# Patient Record
Sex: Female | Born: 1965
Health system: Southern US, Community
[De-identification: ages and names within clinical notes are randomized; demographics above are authoritative.]

## PROBLEM LIST (undated history)

## (undated) DIAGNOSIS — E785 Hyperlipidemia, unspecified: Secondary | ICD-10-CM

## (undated) DIAGNOSIS — G43909 Migraine, unspecified, not intractable, without status migrainosus: Secondary | ICD-10-CM

## (undated) DIAGNOSIS — M1990 Unspecified osteoarthritis, unspecified site: Secondary | ICD-10-CM

## (undated) HISTORY — DX: Hyperlipidemia, unspecified: E78.5

## (undated) HISTORY — PX: CHOLECYSTECTOMY: SHX55

## (undated) HISTORY — DX: Migraine, unspecified, not intractable, without status migrainosus: G43.909

## (undated) HISTORY — PX: WISDOM TOOTH EXTRACTION: SHX21

---

## 2015-10-12 DIAGNOSIS — Z0000 Encounter for general adult medical examination without abnormal findings: Secondary | ICD-10-CM | POA: Diagnosis not present

## 2015-10-12 DIAGNOSIS — E559 Vitamin D deficiency, unspecified: Secondary | ICD-10-CM | POA: Diagnosis not present

## 2015-10-12 DIAGNOSIS — E785 Hyperlipidemia, unspecified: Secondary | ICD-10-CM | POA: Diagnosis not present

## 2015-10-14 DIAGNOSIS — E669 Obesity, unspecified: Secondary | ICD-10-CM | POA: Diagnosis not present

## 2015-10-14 DIAGNOSIS — Z6836 Body mass index (BMI) 36.0-36.9, adult: Secondary | ICD-10-CM | POA: Diagnosis not present

## 2015-10-14 DIAGNOSIS — G43009 Migraine without aura, not intractable, without status migrainosus: Secondary | ICD-10-CM | POA: Diagnosis not present

## 2015-10-14 DIAGNOSIS — E785 Hyperlipidemia, unspecified: Secondary | ICD-10-CM | POA: Diagnosis not present

## 2015-10-14 DIAGNOSIS — Z0000 Encounter for general adult medical examination without abnormal findings: Secondary | ICD-10-CM | POA: Diagnosis not present

## 2016-01-12 MED FILL — TOPIRAMATE 50 MG TABLET: 50 | 90 days supply | Qty: 90 | Fill #0

## 2016-01-12 MED FILL — ATORVASTATIN 10 MG TABLET: 10 | 90 days supply | Qty: 90 | Fill #0

## 2016-03-18 DIAGNOSIS — H524 Presbyopia: Secondary | ICD-10-CM | POA: Diagnosis not present

## 2016-03-22 DIAGNOSIS — M4317 Spondylolisthesis, lumbosacral region: Secondary | ICD-10-CM | POA: Diagnosis not present

## 2016-03-22 DIAGNOSIS — Q72811 Congenital shortening of right lower limb: Secondary | ICD-10-CM | POA: Diagnosis not present

## 2016-03-22 DIAGNOSIS — M9905 Segmental and somatic dysfunction of pelvic region: Secondary | ICD-10-CM | POA: Diagnosis not present

## 2016-03-22 DIAGNOSIS — M9904 Segmental and somatic dysfunction of sacral region: Secondary | ICD-10-CM | POA: Diagnosis not present

## 2016-03-22 DIAGNOSIS — M9903 Segmental and somatic dysfunction of lumbar region: Secondary | ICD-10-CM | POA: Diagnosis not present

## 2016-03-22 DIAGNOSIS — M5137 Other intervertebral disc degeneration, lumbosacral region: Secondary | ICD-10-CM | POA: Diagnosis not present

## 2016-03-26 DIAGNOSIS — Q72811 Congenital shortening of right lower limb: Secondary | ICD-10-CM | POA: Diagnosis not present

## 2016-03-26 DIAGNOSIS — M4317 Spondylolisthesis, lumbosacral region: Secondary | ICD-10-CM | POA: Diagnosis not present

## 2016-03-26 DIAGNOSIS — M9905 Segmental and somatic dysfunction of pelvic region: Secondary | ICD-10-CM | POA: Diagnosis not present

## 2016-03-26 DIAGNOSIS — M9904 Segmental and somatic dysfunction of sacral region: Secondary | ICD-10-CM | POA: Diagnosis not present

## 2016-03-26 DIAGNOSIS — M5137 Other intervertebral disc degeneration, lumbosacral region: Secondary | ICD-10-CM | POA: Diagnosis not present

## 2016-03-26 DIAGNOSIS — M9903 Segmental and somatic dysfunction of lumbar region: Secondary | ICD-10-CM | POA: Diagnosis not present

## 2016-03-27 DIAGNOSIS — Q72811 Congenital shortening of right lower limb: Secondary | ICD-10-CM | POA: Diagnosis not present

## 2016-03-27 DIAGNOSIS — M5137 Other intervertebral disc degeneration, lumbosacral region: Secondary | ICD-10-CM | POA: Diagnosis not present

## 2016-03-27 DIAGNOSIS — M4317 Spondylolisthesis, lumbosacral region: Secondary | ICD-10-CM | POA: Diagnosis not present

## 2016-03-27 DIAGNOSIS — M9905 Segmental and somatic dysfunction of pelvic region: Secondary | ICD-10-CM | POA: Diagnosis not present

## 2016-03-27 DIAGNOSIS — M9904 Segmental and somatic dysfunction of sacral region: Secondary | ICD-10-CM | POA: Diagnosis not present

## 2016-03-27 DIAGNOSIS — M9903 Segmental and somatic dysfunction of lumbar region: Secondary | ICD-10-CM | POA: Diagnosis not present

## 2016-03-29 DIAGNOSIS — M9904 Segmental and somatic dysfunction of sacral region: Secondary | ICD-10-CM | POA: Diagnosis not present

## 2016-03-29 DIAGNOSIS — M4317 Spondylolisthesis, lumbosacral region: Secondary | ICD-10-CM | POA: Diagnosis not present

## 2016-03-29 DIAGNOSIS — M5137 Other intervertebral disc degeneration, lumbosacral region: Secondary | ICD-10-CM | POA: Diagnosis not present

## 2016-03-29 DIAGNOSIS — Q72811 Congenital shortening of right lower limb: Secondary | ICD-10-CM | POA: Diagnosis not present

## 2016-03-29 DIAGNOSIS — M9905 Segmental and somatic dysfunction of pelvic region: Secondary | ICD-10-CM | POA: Diagnosis not present

## 2016-03-29 DIAGNOSIS — M9903 Segmental and somatic dysfunction of lumbar region: Secondary | ICD-10-CM | POA: Diagnosis not present

## 2016-04-02 DIAGNOSIS — M4317 Spondylolisthesis, lumbosacral region: Secondary | ICD-10-CM | POA: Diagnosis not present

## 2016-04-02 DIAGNOSIS — Q72811 Congenital shortening of right lower limb: Secondary | ICD-10-CM | POA: Diagnosis not present

## 2016-04-02 DIAGNOSIS — M9903 Segmental and somatic dysfunction of lumbar region: Secondary | ICD-10-CM | POA: Diagnosis not present

## 2016-04-02 DIAGNOSIS — M9904 Segmental and somatic dysfunction of sacral region: Secondary | ICD-10-CM | POA: Diagnosis not present

## 2016-04-02 DIAGNOSIS — M9905 Segmental and somatic dysfunction of pelvic region: Secondary | ICD-10-CM | POA: Diagnosis not present

## 2016-04-02 DIAGNOSIS — M5137 Other intervertebral disc degeneration, lumbosacral region: Secondary | ICD-10-CM | POA: Diagnosis not present

## 2016-04-03 DIAGNOSIS — M9905 Segmental and somatic dysfunction of pelvic region: Secondary | ICD-10-CM | POA: Diagnosis not present

## 2016-04-03 DIAGNOSIS — M5137 Other intervertebral disc degeneration, lumbosacral region: Secondary | ICD-10-CM | POA: Diagnosis not present

## 2016-04-03 DIAGNOSIS — Q72811 Congenital shortening of right lower limb: Secondary | ICD-10-CM | POA: Diagnosis not present

## 2016-04-03 DIAGNOSIS — M9903 Segmental and somatic dysfunction of lumbar region: Secondary | ICD-10-CM | POA: Diagnosis not present

## 2016-04-03 DIAGNOSIS — M9904 Segmental and somatic dysfunction of sacral region: Secondary | ICD-10-CM | POA: Diagnosis not present

## 2016-04-03 DIAGNOSIS — M4317 Spondylolisthesis, lumbosacral region: Secondary | ICD-10-CM | POA: Diagnosis not present

## 2016-04-05 DIAGNOSIS — Q72811 Congenital shortening of right lower limb: Secondary | ICD-10-CM | POA: Diagnosis not present

## 2016-04-05 DIAGNOSIS — M5137 Other intervertebral disc degeneration, lumbosacral region: Secondary | ICD-10-CM | POA: Diagnosis not present

## 2016-04-05 DIAGNOSIS — M4317 Spondylolisthesis, lumbosacral region: Secondary | ICD-10-CM | POA: Diagnosis not present

## 2016-04-05 DIAGNOSIS — M9904 Segmental and somatic dysfunction of sacral region: Secondary | ICD-10-CM | POA: Diagnosis not present

## 2016-04-05 DIAGNOSIS — M9901 Segmental and somatic dysfunction of cervical region: Secondary | ICD-10-CM | POA: Diagnosis not present

## 2016-04-05 DIAGNOSIS — M9903 Segmental and somatic dysfunction of lumbar region: Secondary | ICD-10-CM | POA: Diagnosis not present

## 2016-04-05 DIAGNOSIS — G43009 Migraine without aura, not intractable, without status migrainosus: Secondary | ICD-10-CM | POA: Diagnosis not present

## 2016-04-05 DIAGNOSIS — M5381 Other specified dorsopathies, occipito-atlanto-axial region: Secondary | ICD-10-CM | POA: Diagnosis not present

## 2016-04-05 DIAGNOSIS — M9905 Segmental and somatic dysfunction of pelvic region: Secondary | ICD-10-CM | POA: Diagnosis not present

## 2016-04-07 DIAGNOSIS — E785 Hyperlipidemia, unspecified: Secondary | ICD-10-CM | POA: Diagnosis not present

## 2016-04-09 DIAGNOSIS — M9905 Segmental and somatic dysfunction of pelvic region: Secondary | ICD-10-CM | POA: Diagnosis not present

## 2016-04-09 DIAGNOSIS — M5137 Other intervertebral disc degeneration, lumbosacral region: Secondary | ICD-10-CM | POA: Diagnosis not present

## 2016-04-09 DIAGNOSIS — M9904 Segmental and somatic dysfunction of sacral region: Secondary | ICD-10-CM | POA: Diagnosis not present

## 2016-04-09 DIAGNOSIS — M9903 Segmental and somatic dysfunction of lumbar region: Secondary | ICD-10-CM | POA: Diagnosis not present

## 2016-04-09 DIAGNOSIS — Q72811 Congenital shortening of right lower limb: Secondary | ICD-10-CM | POA: Diagnosis not present

## 2016-04-09 DIAGNOSIS — M9901 Segmental and somatic dysfunction of cervical region: Secondary | ICD-10-CM | POA: Diagnosis not present

## 2016-04-09 DIAGNOSIS — M4317 Spondylolisthesis, lumbosacral region: Secondary | ICD-10-CM | POA: Diagnosis not present

## 2016-04-09 DIAGNOSIS — G43009 Migraine without aura, not intractable, without status migrainosus: Secondary | ICD-10-CM | POA: Diagnosis not present

## 2016-04-09 DIAGNOSIS — M5381 Other specified dorsopathies, occipito-atlanto-axial region: Secondary | ICD-10-CM | POA: Diagnosis not present

## 2016-04-10 DIAGNOSIS — M5381 Other specified dorsopathies, occipito-atlanto-axial region: Secondary | ICD-10-CM | POA: Diagnosis not present

## 2016-04-10 DIAGNOSIS — M5137 Other intervertebral disc degeneration, lumbosacral region: Secondary | ICD-10-CM | POA: Diagnosis not present

## 2016-04-10 DIAGNOSIS — M5136 Other intervertebral disc degeneration, lumbar region: Secondary | ICD-10-CM | POA: Diagnosis not present

## 2016-04-10 DIAGNOSIS — G43009 Migraine without aura, not intractable, without status migrainosus: Secondary | ICD-10-CM | POA: Diagnosis not present

## 2016-04-10 DIAGNOSIS — M9905 Segmental and somatic dysfunction of pelvic region: Secondary | ICD-10-CM | POA: Diagnosis not present

## 2016-04-10 DIAGNOSIS — M4317 Spondylolisthesis, lumbosacral region: Secondary | ICD-10-CM | POA: Diagnosis not present

## 2016-04-10 DIAGNOSIS — Q72811 Congenital shortening of right lower limb: Secondary | ICD-10-CM | POA: Diagnosis not present

## 2016-04-10 DIAGNOSIS — M9904 Segmental and somatic dysfunction of sacral region: Secondary | ICD-10-CM | POA: Diagnosis not present

## 2016-04-10 DIAGNOSIS — M9901 Segmental and somatic dysfunction of cervical region: Secondary | ICD-10-CM | POA: Diagnosis not present

## 2016-04-10 DIAGNOSIS — M9903 Segmental and somatic dysfunction of lumbar region: Secondary | ICD-10-CM | POA: Diagnosis not present

## 2016-04-12 DIAGNOSIS — M9905 Segmental and somatic dysfunction of pelvic region: Secondary | ICD-10-CM | POA: Diagnosis not present

## 2016-04-12 DIAGNOSIS — M4317 Spondylolisthesis, lumbosacral region: Secondary | ICD-10-CM | POA: Diagnosis not present

## 2016-04-12 DIAGNOSIS — M9904 Segmental and somatic dysfunction of sacral region: Secondary | ICD-10-CM | POA: Diagnosis not present

## 2016-04-12 DIAGNOSIS — Q72811 Congenital shortening of right lower limb: Secondary | ICD-10-CM | POA: Diagnosis not present

## 2016-04-12 DIAGNOSIS — M9901 Segmental and somatic dysfunction of cervical region: Secondary | ICD-10-CM | POA: Diagnosis not present

## 2016-04-12 DIAGNOSIS — M9903 Segmental and somatic dysfunction of lumbar region: Secondary | ICD-10-CM | POA: Diagnosis not present

## 2016-04-12 DIAGNOSIS — M5137 Other intervertebral disc degeneration, lumbosacral region: Secondary | ICD-10-CM | POA: Diagnosis not present

## 2016-04-12 DIAGNOSIS — M5381 Other specified dorsopathies, occipito-atlanto-axial region: Secondary | ICD-10-CM | POA: Diagnosis not present

## 2016-04-12 DIAGNOSIS — G43009 Migraine without aura, not intractable, without status migrainosus: Secondary | ICD-10-CM | POA: Diagnosis not present

## 2016-04-13 DIAGNOSIS — G43009 Migraine without aura, not intractable, without status migrainosus: Secondary | ICD-10-CM | POA: Diagnosis not present

## 2016-04-13 DIAGNOSIS — E785 Hyperlipidemia, unspecified: Secondary | ICD-10-CM | POA: Diagnosis not present

## 2016-04-13 DIAGNOSIS — Z1211 Encounter for screening for malignant neoplasm of colon: Secondary | ICD-10-CM | POA: Diagnosis not present

## 2016-04-13 MED FILL — ATORVASTATIN 10 MG TABLET: 10 | 90 days supply | Qty: 90 | Fill #0

## 2016-04-13 MED FILL — TOPIRAMATE 50 MG TABLET: 50 | 90 days supply | Qty: 90 | Fill #0

## 2016-04-16 ENCOUNTER — Encounter: Payer: Self-pay | Admitting: Gastroenterology

## 2016-04-16 DIAGNOSIS — M9904 Segmental and somatic dysfunction of sacral region: Secondary | ICD-10-CM | POA: Diagnosis not present

## 2016-04-16 DIAGNOSIS — M9905 Segmental and somatic dysfunction of pelvic region: Secondary | ICD-10-CM | POA: Diagnosis not present

## 2016-04-16 DIAGNOSIS — M4317 Spondylolisthesis, lumbosacral region: Secondary | ICD-10-CM | POA: Diagnosis not present

## 2016-04-16 DIAGNOSIS — M9901 Segmental and somatic dysfunction of cervical region: Secondary | ICD-10-CM | POA: Diagnosis not present

## 2016-04-16 DIAGNOSIS — G43009 Migraine without aura, not intractable, without status migrainosus: Secondary | ICD-10-CM | POA: Diagnosis not present

## 2016-04-16 DIAGNOSIS — M9903 Segmental and somatic dysfunction of lumbar region: Secondary | ICD-10-CM | POA: Diagnosis not present

## 2016-04-16 DIAGNOSIS — M5381 Other specified dorsopathies, occipito-atlanto-axial region: Secondary | ICD-10-CM | POA: Diagnosis not present

## 2016-04-16 DIAGNOSIS — Q72811 Congenital shortening of right lower limb: Secondary | ICD-10-CM | POA: Diagnosis not present

## 2016-04-16 DIAGNOSIS — M5137 Other intervertebral disc degeneration, lumbosacral region: Secondary | ICD-10-CM | POA: Diagnosis not present

## 2016-04-17 DIAGNOSIS — M5381 Other specified dorsopathies, occipito-atlanto-axial region: Secondary | ICD-10-CM | POA: Diagnosis not present

## 2016-04-17 DIAGNOSIS — M5137 Other intervertebral disc degeneration, lumbosacral region: Secondary | ICD-10-CM | POA: Diagnosis not present

## 2016-04-17 DIAGNOSIS — M9903 Segmental and somatic dysfunction of lumbar region: Secondary | ICD-10-CM | POA: Diagnosis not present

## 2016-04-17 DIAGNOSIS — M9901 Segmental and somatic dysfunction of cervical region: Secondary | ICD-10-CM | POA: Diagnosis not present

## 2016-04-17 DIAGNOSIS — G43009 Migraine without aura, not intractable, without status migrainosus: Secondary | ICD-10-CM | POA: Diagnosis not present

## 2016-04-17 DIAGNOSIS — Q72811 Congenital shortening of right lower limb: Secondary | ICD-10-CM | POA: Diagnosis not present

## 2016-04-17 DIAGNOSIS — M9905 Segmental and somatic dysfunction of pelvic region: Secondary | ICD-10-CM | POA: Diagnosis not present

## 2016-04-17 DIAGNOSIS — M9904 Segmental and somatic dysfunction of sacral region: Secondary | ICD-10-CM | POA: Diagnosis not present

## 2016-04-17 DIAGNOSIS — M4317 Spondylolisthesis, lumbosacral region: Secondary | ICD-10-CM | POA: Diagnosis not present

## 2016-04-19 DIAGNOSIS — M4317 Spondylolisthesis, lumbosacral region: Secondary | ICD-10-CM | POA: Diagnosis not present

## 2016-04-19 DIAGNOSIS — Q72811 Congenital shortening of right lower limb: Secondary | ICD-10-CM | POA: Diagnosis not present

## 2016-04-19 DIAGNOSIS — M9903 Segmental and somatic dysfunction of lumbar region: Secondary | ICD-10-CM | POA: Diagnosis not present

## 2016-04-19 DIAGNOSIS — M9904 Segmental and somatic dysfunction of sacral region: Secondary | ICD-10-CM | POA: Diagnosis not present

## 2016-04-19 DIAGNOSIS — M9901 Segmental and somatic dysfunction of cervical region: Secondary | ICD-10-CM | POA: Diagnosis not present

## 2016-04-19 DIAGNOSIS — G43009 Migraine without aura, not intractable, without status migrainosus: Secondary | ICD-10-CM | POA: Diagnosis not present

## 2016-04-19 DIAGNOSIS — M5137 Other intervertebral disc degeneration, lumbosacral region: Secondary | ICD-10-CM | POA: Diagnosis not present

## 2016-04-19 DIAGNOSIS — M5381 Other specified dorsopathies, occipito-atlanto-axial region: Secondary | ICD-10-CM | POA: Diagnosis not present

## 2016-04-19 DIAGNOSIS — M9905 Segmental and somatic dysfunction of pelvic region: Secondary | ICD-10-CM | POA: Diagnosis not present

## 2016-04-23 DIAGNOSIS — M4317 Spondylolisthesis, lumbosacral region: Secondary | ICD-10-CM | POA: Diagnosis not present

## 2016-04-23 DIAGNOSIS — M9904 Segmental and somatic dysfunction of sacral region: Secondary | ICD-10-CM | POA: Diagnosis not present

## 2016-04-23 DIAGNOSIS — M9903 Segmental and somatic dysfunction of lumbar region: Secondary | ICD-10-CM | POA: Diagnosis not present

## 2016-04-23 DIAGNOSIS — M9905 Segmental and somatic dysfunction of pelvic region: Secondary | ICD-10-CM | POA: Diagnosis not present

## 2016-04-23 DIAGNOSIS — M9901 Segmental and somatic dysfunction of cervical region: Secondary | ICD-10-CM | POA: Diagnosis not present

## 2016-04-23 DIAGNOSIS — G43009 Migraine without aura, not intractable, without status migrainosus: Secondary | ICD-10-CM | POA: Diagnosis not present

## 2016-04-23 DIAGNOSIS — M5137 Other intervertebral disc degeneration, lumbosacral region: Secondary | ICD-10-CM | POA: Diagnosis not present

## 2016-04-23 DIAGNOSIS — Q72811 Congenital shortening of right lower limb: Secondary | ICD-10-CM | POA: Diagnosis not present

## 2016-04-23 DIAGNOSIS — M5381 Other specified dorsopathies, occipito-atlanto-axial region: Secondary | ICD-10-CM | POA: Diagnosis not present

## 2016-04-24 DIAGNOSIS — Z01419 Encounter for gynecological examination (general) (routine) without abnormal findings: Secondary | ICD-10-CM | POA: Diagnosis not present

## 2016-04-24 DIAGNOSIS — E559 Vitamin D deficiency, unspecified: Secondary | ICD-10-CM | POA: Diagnosis not present

## 2016-04-25 DIAGNOSIS — M5137 Other intervertebral disc degeneration, lumbosacral region: Secondary | ICD-10-CM | POA: Diagnosis not present

## 2016-04-25 DIAGNOSIS — G43009 Migraine without aura, not intractable, without status migrainosus: Secondary | ICD-10-CM | POA: Diagnosis not present

## 2016-04-25 DIAGNOSIS — M9905 Segmental and somatic dysfunction of pelvic region: Secondary | ICD-10-CM | POA: Diagnosis not present

## 2016-04-25 DIAGNOSIS — Q72811 Congenital shortening of right lower limb: Secondary | ICD-10-CM | POA: Diagnosis not present

## 2016-04-25 DIAGNOSIS — M4317 Spondylolisthesis, lumbosacral region: Secondary | ICD-10-CM | POA: Diagnosis not present

## 2016-04-25 DIAGNOSIS — M9903 Segmental and somatic dysfunction of lumbar region: Secondary | ICD-10-CM | POA: Diagnosis not present

## 2016-04-25 DIAGNOSIS — M9901 Segmental and somatic dysfunction of cervical region: Secondary | ICD-10-CM | POA: Diagnosis not present

## 2016-04-25 DIAGNOSIS — M9904 Segmental and somatic dysfunction of sacral region: Secondary | ICD-10-CM | POA: Diagnosis not present

## 2016-04-25 DIAGNOSIS — M5381 Other specified dorsopathies, occipito-atlanto-axial region: Secondary | ICD-10-CM | POA: Diagnosis not present

## 2016-05-01 DIAGNOSIS — M9905 Segmental and somatic dysfunction of pelvic region: Secondary | ICD-10-CM | POA: Diagnosis not present

## 2016-05-01 DIAGNOSIS — G43009 Migraine without aura, not intractable, without status migrainosus: Secondary | ICD-10-CM | POA: Diagnosis not present

## 2016-05-01 DIAGNOSIS — M9904 Segmental and somatic dysfunction of sacral region: Secondary | ICD-10-CM | POA: Diagnosis not present

## 2016-05-01 DIAGNOSIS — M5381 Other specified dorsopathies, occipito-atlanto-axial region: Secondary | ICD-10-CM | POA: Diagnosis not present

## 2016-05-01 DIAGNOSIS — M4317 Spondylolisthesis, lumbosacral region: Secondary | ICD-10-CM | POA: Diagnosis not present

## 2016-05-01 DIAGNOSIS — M9901 Segmental and somatic dysfunction of cervical region: Secondary | ICD-10-CM | POA: Diagnosis not present

## 2016-05-01 DIAGNOSIS — M5137 Other intervertebral disc degeneration, lumbosacral region: Secondary | ICD-10-CM | POA: Diagnosis not present

## 2016-05-01 DIAGNOSIS — M9903 Segmental and somatic dysfunction of lumbar region: Secondary | ICD-10-CM | POA: Diagnosis not present

## 2016-05-01 DIAGNOSIS — Q72811 Congenital shortening of right lower limb: Secondary | ICD-10-CM | POA: Diagnosis not present

## 2016-05-03 DIAGNOSIS — G43009 Migraine without aura, not intractable, without status migrainosus: Secondary | ICD-10-CM | POA: Diagnosis not present

## 2016-05-03 DIAGNOSIS — M9905 Segmental and somatic dysfunction of pelvic region: Secondary | ICD-10-CM | POA: Diagnosis not present

## 2016-05-03 DIAGNOSIS — M9901 Segmental and somatic dysfunction of cervical region: Secondary | ICD-10-CM | POA: Diagnosis not present

## 2016-05-03 DIAGNOSIS — Q72811 Congenital shortening of right lower limb: Secondary | ICD-10-CM | POA: Diagnosis not present

## 2016-05-03 DIAGNOSIS — M9903 Segmental and somatic dysfunction of lumbar region: Secondary | ICD-10-CM | POA: Diagnosis not present

## 2016-05-03 DIAGNOSIS — M4317 Spondylolisthesis, lumbosacral region: Secondary | ICD-10-CM | POA: Diagnosis not present

## 2016-05-03 DIAGNOSIS — M5381 Other specified dorsopathies, occipito-atlanto-axial region: Secondary | ICD-10-CM | POA: Diagnosis not present

## 2016-05-03 DIAGNOSIS — M9904 Segmental and somatic dysfunction of sacral region: Secondary | ICD-10-CM | POA: Diagnosis not present

## 2016-05-03 DIAGNOSIS — M5137 Other intervertebral disc degeneration, lumbosacral region: Secondary | ICD-10-CM | POA: Diagnosis not present

## 2016-05-10 DIAGNOSIS — M9903 Segmental and somatic dysfunction of lumbar region: Secondary | ICD-10-CM | POA: Diagnosis not present

## 2016-05-10 DIAGNOSIS — M5381 Other specified dorsopathies, occipito-atlanto-axial region: Secondary | ICD-10-CM | POA: Diagnosis not present

## 2016-05-10 DIAGNOSIS — M4317 Spondylolisthesis, lumbosacral region: Secondary | ICD-10-CM | POA: Diagnosis not present

## 2016-05-10 DIAGNOSIS — G43009 Migraine without aura, not intractable, without status migrainosus: Secondary | ICD-10-CM | POA: Diagnosis not present

## 2016-05-10 DIAGNOSIS — M5137 Other intervertebral disc degeneration, lumbosacral region: Secondary | ICD-10-CM | POA: Diagnosis not present

## 2016-05-10 DIAGNOSIS — Q72811 Congenital shortening of right lower limb: Secondary | ICD-10-CM | POA: Diagnosis not present

## 2016-05-10 DIAGNOSIS — M9905 Segmental and somatic dysfunction of pelvic region: Secondary | ICD-10-CM | POA: Diagnosis not present

## 2016-05-10 DIAGNOSIS — M9901 Segmental and somatic dysfunction of cervical region: Secondary | ICD-10-CM | POA: Diagnosis not present

## 2016-05-10 DIAGNOSIS — M9904 Segmental and somatic dysfunction of sacral region: Secondary | ICD-10-CM | POA: Diagnosis not present

## 2016-05-11 ENCOUNTER — Ambulatory Visit: Payer: 59 | Admitting: *Deleted

## 2016-05-11 VITALS — Ht 63.0 in | Wt 215.4 lb

## 2016-05-11 DIAGNOSIS — Z1211 Encounter for screening for malignant neoplasm of colon: Secondary | ICD-10-CM

## 2016-05-11 MED ORDER — SUPREP BOWEL PREP KIT 17.5-3.13-1.6 GM/177ML PO SOLN: 1.0000 | Freq: Once | ORAL | 0 refills | Status: AC

## 2016-05-11 MED FILL — SUPREP BOWEL PREP KIT: 17.5-3.13-1 | 2 days supply | Qty: 354 | Fill #0

## 2016-05-11 NOTE — Progress Notes (Signed)
Patient denies any allergies to egg or soy products. Patient denies complications with anesthesia/sedation.  Patient denies oxygen use at home and denies diet medications. Emmi instructions for colonoscopy  explained and pamphlet given.

## 2016-05-16 DIAGNOSIS — M5137 Other intervertebral disc degeneration, lumbosacral region: Secondary | ICD-10-CM | POA: Diagnosis not present

## 2016-05-16 DIAGNOSIS — M9901 Segmental and somatic dysfunction of cervical region: Secondary | ICD-10-CM | POA: Diagnosis not present

## 2016-05-16 DIAGNOSIS — M9904 Segmental and somatic dysfunction of sacral region: Secondary | ICD-10-CM | POA: Diagnosis not present

## 2016-05-16 DIAGNOSIS — G43009 Migraine without aura, not intractable, without status migrainosus: Secondary | ICD-10-CM | POA: Diagnosis not present

## 2016-05-16 DIAGNOSIS — M5381 Other specified dorsopathies, occipito-atlanto-axial region: Secondary | ICD-10-CM | POA: Diagnosis not present

## 2016-05-16 DIAGNOSIS — M9903 Segmental and somatic dysfunction of lumbar region: Secondary | ICD-10-CM | POA: Diagnosis not present

## 2016-05-16 DIAGNOSIS — Q72811 Congenital shortening of right lower limb: Secondary | ICD-10-CM | POA: Diagnosis not present

## 2016-05-16 DIAGNOSIS — M9905 Segmental and somatic dysfunction of pelvic region: Secondary | ICD-10-CM | POA: Diagnosis not present

## 2016-05-16 DIAGNOSIS — M4317 Spondylolisthesis, lumbosacral region: Secondary | ICD-10-CM | POA: Diagnosis not present

## 2016-05-25 ENCOUNTER — Encounter: Payer: Self-pay | Admitting: Gastroenterology

## 2016-05-30 DIAGNOSIS — Q72811 Congenital shortening of right lower limb: Secondary | ICD-10-CM | POA: Diagnosis not present

## 2016-05-30 DIAGNOSIS — M9903 Segmental and somatic dysfunction of lumbar region: Secondary | ICD-10-CM | POA: Diagnosis not present

## 2016-05-30 DIAGNOSIS — M9904 Segmental and somatic dysfunction of sacral region: Secondary | ICD-10-CM | POA: Diagnosis not present

## 2016-05-30 DIAGNOSIS — G43009 Migraine without aura, not intractable, without status migrainosus: Secondary | ICD-10-CM | POA: Diagnosis not present

## 2016-05-30 DIAGNOSIS — M9901 Segmental and somatic dysfunction of cervical region: Secondary | ICD-10-CM | POA: Diagnosis not present

## 2016-05-30 DIAGNOSIS — M5137 Other intervertebral disc degeneration, lumbosacral region: Secondary | ICD-10-CM | POA: Diagnosis not present

## 2016-05-30 DIAGNOSIS — M5381 Other specified dorsopathies, occipito-atlanto-axial region: Secondary | ICD-10-CM | POA: Diagnosis not present

## 2016-05-30 DIAGNOSIS — M4317 Spondylolisthesis, lumbosacral region: Secondary | ICD-10-CM | POA: Diagnosis not present

## 2016-05-30 DIAGNOSIS — M9905 Segmental and somatic dysfunction of pelvic region: Secondary | ICD-10-CM | POA: Diagnosis not present

## 2016-06-20 DIAGNOSIS — M9905 Segmental and somatic dysfunction of pelvic region: Secondary | ICD-10-CM | POA: Diagnosis not present

## 2016-06-20 DIAGNOSIS — M9901 Segmental and somatic dysfunction of cervical region: Secondary | ICD-10-CM | POA: Diagnosis not present

## 2016-06-20 DIAGNOSIS — M5137 Other intervertebral disc degeneration, lumbosacral region: Secondary | ICD-10-CM | POA: Diagnosis not present

## 2016-06-20 DIAGNOSIS — Q72811 Congenital shortening of right lower limb: Secondary | ICD-10-CM | POA: Diagnosis not present

## 2016-06-20 DIAGNOSIS — M5381 Other specified dorsopathies, occipito-atlanto-axial region: Secondary | ICD-10-CM | POA: Diagnosis not present

## 2016-06-20 DIAGNOSIS — M9904 Segmental and somatic dysfunction of sacral region: Secondary | ICD-10-CM | POA: Diagnosis not present

## 2016-06-20 DIAGNOSIS — G43009 Migraine without aura, not intractable, without status migrainosus: Secondary | ICD-10-CM | POA: Diagnosis not present

## 2016-06-20 DIAGNOSIS — M9903 Segmental and somatic dysfunction of lumbar region: Secondary | ICD-10-CM | POA: Diagnosis not present

## 2016-06-20 DIAGNOSIS — M4317 Spondylolisthesis, lumbosacral region: Secondary | ICD-10-CM | POA: Diagnosis not present

## 2016-06-25 ENCOUNTER — Ambulatory Visit (AMBULATORY_SURGERY_CENTER): Payer: 59 | Admitting: Gastroenterology

## 2016-06-25 ENCOUNTER — Encounter: Payer: Self-pay | Admitting: Gastroenterology

## 2016-06-25 VITALS — BP 105/57 | HR 56 | Temp 97.7°F | Resp 13 | Ht 63.0 in | Wt 215.0 lb

## 2016-06-25 DIAGNOSIS — Z1211 Encounter for screening for malignant neoplasm of colon: Secondary | ICD-10-CM | POA: Diagnosis present

## 2016-06-25 DIAGNOSIS — Z1212 Encounter for screening for malignant neoplasm of rectum: Secondary | ICD-10-CM

## 2016-06-25 MED ORDER — SODIUM CHLORIDE 0.9 % IV SOLN: 500.0000 mL | INTRAVENOUS | Status: AC

## 2016-06-25 NOTE — Patient Instructions (Signed)
Discharge instructions given. Handouts on hemorrhoids and a high fiber diet. Resume previous medications. YOU HAD AN ENDOSCOPIC PROCEDURE TODAY AT [facility]:   Refer to the procedure report that was given to you for any specific questions about what was found during the examination.  If the procedure report does not answer your questions, please call your gastroenterologist to clarify.  If you requested that your care partner not be given the details of your procedure findings, then the procedure report has been included in a sealed envelope for you to review at your convenience later.  YOU SHOULD EXPECT: Some feelings of bloating in the abdomen. Passage of more gas than usual.  Walking can help get rid of the air that was put into your GI tract during the procedure and reduce the bloating. If you had a lower endoscopy (such as a colonoscopy or flexible sigmoidoscopy) you may notice spotting of blood in your stool or on the toilet paper. If you underwent a bowel prep for your procedure, you may not have a normal bowel movement for a few days.  Please Note:  You might notice some irritation and congestion in your nose or some drainage.  This is from the oxygen used during your procedure.  There is no need for concern and it should clear up in a day or so.  SYMPTOMS TO REPORT IMMEDIATELY:   Following lower endoscopy (colonoscopy or flexible sigmoidoscopy):  Excessive amounts of blood in the stool  Significant tenderness or worsening of abdominal pains  Swelling of the abdomen that is new, acute  Fever of 100F or higher   For urgent or emergent issues, a gastroenterologist can be reached at any hour by calling [phone].   DIET:  We do recommend a small meal at first, but then you may proceed to your regular diet.  Drink plenty of fluids but you should avoid alcoholic beverages for 24 hours.  ACTIVITY:  You should plan to take it easy for the rest of today and you should  NOT DRIVE or use heavy machinery until tomorrow (because of the sedation medicines used during the test).    FOLLOW UP: Our staff will call the number listed on your records the next business day following your procedure to check on you and address any questions or concerns that you may have regarding the information given to you following your procedure. If we do not reach you, we will leave a message.  However, if you are feeling well and you are not experiencing any problems, there is no need to return our call.  We will assume that you have returned to your regular daily activities without incident.  If any biopsies were taken you will be contacted by phone or by letter within the next 1-3 weeks.  Please call us at [phone] if you have not heard about the biopsies in 3 weeks.    SIGNATURES/CONFIDENTIALITY: You and/or your care partner have signed paperwork which will be entered into your electronic medical record.  These signatures attest to the fact that that the information above on your After Visit Summary has been reviewed and is understood.  Full responsibility of the confidentiality of this discharge information lies with you and/or your care-partner.

## 2016-06-25 NOTE — Progress Notes (Signed)
To PACU Pt awake and alert. Report to RN 

## 2016-06-25 NOTE — Op Note (Signed)
[facility] Patient Name: Kelsey Henderson Procedure Date: 06/25/2016 11:34 AM MRN: [id number] Endoscopist: Mauri Pole , MD Age: 50 Referring MD:  Date of Birth: Aug 17, 1966 Gender: Female Account #: [id number] Procedure:                Colonoscopy Indications:              Screening for colorectal malignant neoplasm, This                            is the patient's first colonoscopy Medicines:                Monitored Anesthesia Care Procedure:                Pre-Anesthesia Assessment:                           - Prior to the procedure, a History and Physical                            was performed, and patient medications and                            allergies were reviewed. The patient's tolerance of                            previous anesthesia was also reviewed. The risks                            and benefits of the procedure and the sedation                            options and risks were discussed with the patient.                            All questions were answered, and informed consent                            was obtained. Prior Anticoagulants: The patient has                            taken no previous anticoagulant or antiplatelet                            agents. ASA Grade Assessment: II - A patient with                            mild systemic disease. After reviewing the risks                            and benefits, the patient was deemed in                            satisfactory condition to undergo the procedure.  After obtaining informed consent, the colonoscope                            was passed under direct vision. Throughout the                            procedure, the patient's blood pressure, pulse, and                            oxygen saturations were monitored continuously. The                            Model CF-HQ190L [id number]) scope was introduced                            through the anus  and advanced to the the terminal                            ileum, with identification of the appendiceal                            orifice and IC valve. The colonoscopy was performed                            without difficulty. The patient tolerated the                            procedure well. The quality of the bowel                            preparation was excellent. The terminal ileum,                            ileocecal valve, appendiceal orifice, and rectum                            were photographed. The quality of the bowel                            preparation was evaluated using the BBPS [facility]                            Bowel Preparation Scale) with scores of: Right                            Colon = 3, Transverse Colon = 3 and Left Colon = 3                            (entire mucosa seen well with no residual staining,                            small fragments of stool or opaque liquid). The  total BBPS score equals 9. Scope In: 11:39:32 AM Scope Out: 11:50:34 AM Scope Withdrawal Time: 0 hours 7 minutes 52 seconds  Total Procedure Duration: 0 hours 11 minutes 2 seconds  Findings:                 The perianal and digital rectal examinations were                            normal.                           Non-bleeding internal hemorrhoids were found during                            retroflexion. The hemorrhoids were small.                           The exam was otherwise without abnormality. Complications:            No immediate complications. Estimated Blood Loss:     Estimated blood loss: none. Impression:               - Non-bleeding internal hemorrhoids.                           - The examination was otherwise normal.                           - No specimens collected. Recommendation:           - Patient has a contact number available for                            emergencies. The signs and symptoms of potential                             delayed complications were discussed with the                            patient. Return to normal activities tomorrow.                            Written discharge instructions were provided to the                            patient.                           - Resume previous diet.                           - Continue present medications.                           - Repeat colonoscopy in 10 years for screening                            purposes. Mauri Pole, MD 06/25/2016 11:53:33 AM This  report has been signed electronically.

## 2016-06-26 ENCOUNTER — Telehealth: Payer: Self-pay | Admitting: *Deleted

## 2016-06-26 NOTE — Telephone Encounter (Signed)
  Follow up Call-  Call back number 06/25/2016  Post procedure Call Back phone  # [phone]  Permission to leave phone message Yes     Patient questions:  Do you have a fever, pain , or abdominal swelling? No. Pain Score  0 *  Have you tolerated food without any problems? Yes.    Have you been able to return to your normal activities? Yes.    Do you have any questions about your discharge instructions: Diet   No. Medications  No. Follow up visit  No.  Do you have questions or concerns about your Care? No   Actions: * If pain score is 4 or above: No action needed, pain <4.

## 2016-07-02 ENCOUNTER — Other Ambulatory Visit: Payer: Self-pay | Admitting: Obstetrics and Gynecology

## 2016-07-02 DIAGNOSIS — Z1231 Encounter for screening mammogram for malignant neoplasm of breast: Secondary | ICD-10-CM

## 2016-07-13 MED FILL — TOPIRAMATE 50 MG TABLET: 50 | 90 days supply | Qty: 90 | Fill #1

## 2016-07-13 MED FILL — ATORVASTATIN 10 MG TABLET: 10 | 90 days supply | Qty: 90 | Fill #1

## 2016-07-17 MED FILL — AMOXICILLIN 875 MG TABLET: 875 | 10 days supply | Qty: 20 | Fill #0

## 2016-07-17 MED FILL — IBUPROFEN 800 MG TABLET: 800 | 7 days supply | Qty: 30 | Fill #0

## 2016-08-01 ENCOUNTER — Ambulatory Visit
Admission: RE | Admit: 2016-08-01 | Discharge: 2016-08-01 | Disposition: A | Discharge status: Home / Self Care | Payer: 59 | Source: Ambulatory Visit | Attending: Obstetrics and Gynecology | Admitting: Obstetrics and Gynecology

## 2016-08-01 DIAGNOSIS — Z1231 Encounter for screening mammogram for malignant neoplasm of breast: Secondary | ICD-10-CM | POA: Diagnosis not present

## 2016-10-13 DIAGNOSIS — Z0000 Encounter for general adult medical examination without abnormal findings: Secondary | ICD-10-CM | POA: Diagnosis not present

## 2016-10-15 DIAGNOSIS — Z0000 Encounter for general adult medical examination without abnormal findings: Secondary | ICD-10-CM | POA: Diagnosis not present

## 2016-10-15 DIAGNOSIS — R252 Cramp and spasm: Secondary | ICD-10-CM | POA: Diagnosis not present

## 2016-10-15 DIAGNOSIS — E785 Hyperlipidemia, unspecified: Secondary | ICD-10-CM | POA: Diagnosis not present

## 2016-10-15 DIAGNOSIS — G43009 Migraine without aura, not intractable, without status migrainosus: Secondary | ICD-10-CM | POA: Diagnosis not present

## 2016-10-15 DIAGNOSIS — Z6833 Body mass index (BMI) 33.0-33.9, adult: Secondary | ICD-10-CM | POA: Diagnosis not present

## 2016-10-15 DIAGNOSIS — E669 Obesity, unspecified: Secondary | ICD-10-CM | POA: Diagnosis not present

## 2016-10-15 MED FILL — TOPIRAMATE 50 MG TABLET: 50 | 90 days supply | Qty: 90 | Fill #0

## 2016-10-15 MED FILL — ATORVASTATIN 10 MG TABLET: 10 | 90 days supply | Qty: 90 | Fill #0

## 2017-01-07 MED FILL — ATORVASTATIN 10 MG TABLET: 10 | 90 days supply | Qty: 90 | Fill #1

## 2017-01-07 MED FILL — TOPIRAMATE 50 MG TABLET: 50 | 90 days supply | Qty: 90 | Fill #1

## 2017-04-13 DIAGNOSIS — E785 Hyperlipidemia, unspecified: Secondary | ICD-10-CM | POA: Diagnosis not present

## 2017-04-16 DIAGNOSIS — R0789 Other chest pain: Secondary | ICD-10-CM | POA: Diagnosis not present

## 2017-04-16 DIAGNOSIS — E785 Hyperlipidemia, unspecified: Secondary | ICD-10-CM | POA: Diagnosis not present

## 2017-04-16 DIAGNOSIS — G43009 Migraine without aura, not intractable, without status migrainosus: Secondary | ICD-10-CM | POA: Diagnosis not present

## 2017-04-16 MED FILL — TOPIRAMATE 50 MG TABLET: 50 | 90 days supply | Qty: 90 | Fill #0

## 2017-04-16 MED FILL — SUMATRIPTAN SUCC 100 MG TAB: 100 | 30 days supply | Qty: 10 | Fill #0

## 2017-04-16 MED FILL — ATORVASTATIN 10 MG TABLET: 10 | 90 days supply | Qty: 90 | Fill #0

## 2017-04-25 DIAGNOSIS — Z01419 Encounter for gynecological examination (general) (routine) without abnormal findings: Secondary | ICD-10-CM | POA: Diagnosis not present

## 2017-04-25 DIAGNOSIS — Z1151 Encounter for screening for human papillomavirus (HPV): Secondary | ICD-10-CM | POA: Diagnosis not present

## 2017-06-17 ENCOUNTER — Other Ambulatory Visit: Payer: Self-pay | Admitting: Obstetrics and Gynecology

## 2017-06-17 DIAGNOSIS — Z1231 Encounter for screening mammogram for malignant neoplasm of breast: Secondary | ICD-10-CM

## 2017-07-08 MED FILL — ATORVASTATIN 10 MG TABLET: 10 | 90 days supply | Qty: 90 | Fill #1

## 2017-07-25 MED FILL — TOPIRAMATE 50 MG TABLET: 50 | 90 days supply | Qty: 90 | Fill #1

## 2017-08-02 ENCOUNTER — Ambulatory Visit
Admission: RE | Admit: 2017-08-02 | Discharge: 2017-08-02 | Disposition: A | Discharge status: Home / Self Care | Payer: 59 | Source: Ambulatory Visit | Attending: Obstetrics and Gynecology | Admitting: Obstetrics and Gynecology

## 2017-08-02 DIAGNOSIS — Z1231 Encounter for screening mammogram for malignant neoplasm of breast: Secondary | ICD-10-CM | POA: Diagnosis not present

## 2017-09-28 DIAGNOSIS — H524 Presbyopia: Secondary | ICD-10-CM | POA: Diagnosis not present

## 2017-10-12 DIAGNOSIS — Z0000 Encounter for general adult medical examination without abnormal findings: Secondary | ICD-10-CM | POA: Diagnosis not present

## 2017-10-17 DIAGNOSIS — E669 Obesity, unspecified: Secondary | ICD-10-CM | POA: Diagnosis not present

## 2017-10-17 DIAGNOSIS — M533 Sacrococcygeal disorders, not elsewhere classified: Secondary | ICD-10-CM | POA: Diagnosis not present

## 2017-10-17 DIAGNOSIS — Z0000 Encounter for general adult medical examination without abnormal findings: Secondary | ICD-10-CM | POA: Diagnosis not present

## 2017-10-17 DIAGNOSIS — G43009 Migraine without aura, not intractable, without status migrainosus: Secondary | ICD-10-CM | POA: Diagnosis not present

## 2017-10-17 DIAGNOSIS — E785 Hyperlipidemia, unspecified: Secondary | ICD-10-CM | POA: Diagnosis not present

## 2017-10-17 MED FILL — TOPIRAMATE 50 MG TABLET: 50 | 90 days supply | Qty: 90 | Fill #0

## 2017-10-17 MED FILL — ATORVASTATIN 10 MG TABLET: 10 | 90 days supply | Qty: 90 | Fill #0

## 2017-10-31 ENCOUNTER — Ambulatory Visit: Payer: 59 | Attending: Family Medicine | Admitting: Physical Therapy

## 2017-10-31 ENCOUNTER — Encounter: Payer: Self-pay | Admitting: Physical Therapy

## 2017-10-31 DIAGNOSIS — R293 Abnormal posture: Secondary | ICD-10-CM | POA: Diagnosis not present

## 2017-10-31 DIAGNOSIS — M5441 Lumbago with sciatica, right side: Secondary | ICD-10-CM | POA: Diagnosis not present

## 2017-10-31 DIAGNOSIS — G8929 Other chronic pain: Secondary | ICD-10-CM | POA: Diagnosis not present

## 2017-10-31 NOTE — Patient Instructions (Signed)
HIP: Hamstrings - Short Sitting    Rest leg on raised surface. Keep knee straight. Lift chest. Hold _30__ seconds. __2-3_ reps per set, _2-3__ sets per day, _7__ days per week  Copyright  VHI. All rights reserved.

## 2017-10-31 NOTE — Therapy (Signed)
[facility], [place], [address] Phone: [phone]   Fax:  [phone]  Physical Therapy Evaluation  Patient Details  Name: Kelsey Henderson MRN: [id number] Date of Birth: 02/13/1966 Referring Provider: Jefm Petty, MD   Encounter Date: 10/31/2017  PT End of Session - 10/31/17 1539    Visit Number  1    Number of Visits  12    Date for PT Re-Evaluation  12/12/17    Authorization Type  [employer] $20 copay    PT Start Time  1455    PT Stop Time  1545    PT Time Calculation (min)  50 min    Activity Tolerance  Patient tolerated treatment well    Behavior During Therapy  [facility] for tasks assessed/performed       Past Medical History:  Diagnosis Date  . Hyperlipidemia   . Migraines   . SVD (spontaneous vaginal delivery)    x 2    Past Surgical History:  Procedure Laterality Date  . CHOLECYSTECTOMY    . WISDOM TOOTH EXTRACTION      There were no vitals filed for this visit.   Subjective Assessment - 10/31/17 1456    Subjective  Pt is a 52 y/o female who presents to OPPT for Rt sided LBP.  Pt reports 2 years ago she traveled for work, and pain has increased radiating to Rt knee x 4 months.  Pt states stretching has helped to relieve pain.  Pt states GYN MD feels she may have prolapsed uterus pushing on nerve, but didn't want to perform any medical intervention.      Limitations  Standing;Walking    How long can you stand comfortably?  20 min    How long can you walk comfortably?  15 min    Diagnostic tests  n/a    Patient Stated Goals  improve pain    Currently in Pain?  No/denies    Pain Score  -- up to 10/10    Pain Location  Buttocks    Pain Orientation  Right    Pain Descriptors / Indicators  Sharp;Stabbing    Pain Type  Chronic pain    Pain Onset  More than a month ago    Pain Frequency  Intermittent    Aggravating Factors   turning wrong creates catch, walking, standing    Pain Relieving  Factors  forward bending (stretching)         [facility] PT Assessment - 10/31/17 1501      Assessment   Medical Diagnosis  SI Pain    Referring Provider  Jefm Petty, MD    Onset Date/Surgical Date  -- 4 months    Next MD Visit  Aug 2019    Prior Therapy  none      Precautions   Precautions  None      Restrictions   Weight Bearing Restrictions  No      Balance Screen   Has the patient fallen in the past 6 months  No    Has the patient had a decrease in activity level because of a fear of falling?   No    Is the patient reluctant to leave their home because of a fear of falling?   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    Leisure  walking for exercise (treadmill or outdoors), wedding planning, riding motorcycles      Observation/Other Assessments   Focus on Therapeutic Outcomes (FOTO)   61 (39% limited; predicted 29% limited)      Posture/Postural Control   Posture/Postural Control  Postural limitations    Postural Limitations  Decreased lumbar lordosis;Rounded Shoulders;Forward head      ROM / Strength   AROM / PROM / Strength  AROM;Strength      AROM   AROM Assessment Site  Lumbar    Lumbar Flexion  89    Lumbar Extension  22 with pain    Lumbar - Right Side Bend  31 with pain on Rt side    Lumbar - Left Side Bend  35      Strength   Strength Assessment Site  Hip;Knee;Ankle    Right/Left Hip  Right;Left    Right Hip Flexion  5/5    Right Hip Extension  3+/5    Right Hip ABduction   4/5    Left Hip Flexion  5/5    Left Hip Extension  4/5    Left Hip ABduction  4/5    Right/Left Knee  Right;Left    Right Knee Flexion  5/5    Right Knee Extension  5/5    Left Knee Flexion  5/5    Left Knee Extension  5/5    Right/Left Ankle  Right;Left    Right Ankle Dorsiflexion  5/5    Left Ankle Dorsiflexion  5/5      Flexibility   Soft Tissue Assessment /Muscle Length  yes    Hamstrings  tightness Rt>Lt      Palpation   SI assessment   increased movement Rt SIJ with marching    Palpation comment  trigger points in Rt glute med/min/max      Special Tests    Special Tests  Lumbar;Sacrolliac Tests    Lumbar Tests  Straight Leg Raise    Sacroiliac Tests   Sacral Thrust      Straight Leg Raise   Findings  Positive    Side   Right    Comment  pain reproduced with SLR      Sacral thrust    Findings  Negative             Objective measurements completed on examination: See above findings.      [facility] Adult PT Treatment/Exercise - 10/31/17 1501      Modalities   Modalities  Traction      Traction   Type of Traction  Lumbar    Min (lbs)  60    Max (lbs)  70    Hold Time  60    Rest Time  20    Time  15             PT Education - 10/31/17 1534    Education provided  Yes    Education Details  traction, HEP, clinical findings, POC    Person(s) Educated  Patient    Methods  Explanation;Demonstration;Handout  Comprehension  Verbalized understanding;Returned demonstration;Need further instruction          PT Long Term Goals - 10/31/17 1642      PT LONG TERM GOAL #1   Title  independent with HEP    Status  New    Target Date  12/12/17      PT LONG TERM GOAL #2   Title  verbalize understanding of posture/body mechanics/workstation set up to decrease risk of reinjury    Status  New    Target Date  12/12/17      PT LONG TERM GOAL #3   Title  report centralization of pain for improved symptoms and function    Status  New    Target Date   12/12/17      PT LONG TERM GOAL #4   Title  report ability to walk at least 30 min without increase in pain for improved function    Status  New    Target Date  12/12/17             Plan - 10/31/17 1539    Clinical Impression Statement  Pt is a 52 y/o female who presents to OPPT for Rt sided LBP initiating in buttocks radiating to Rt knee.  Pt with (+) SLR on Rt today so initiated traction and provided HEP to address hamstring tightness.  Pt also demonstrates poor core stability and mild hip weaknes affecting functional mobility.  Pt will benefit from PT to address deficits listed.    Clinical Presentation  Stable    Clinical Decision Making  Low    Rehab Potential  Good    PT Frequency  2x / week    PT Duration  6 weeks    PT Treatment/Interventions  ADLs/Self Care Home Management;Cryotherapy;Electrical Stimulation;Ultrasound;Traction;Moist Heat;Stair training;Functional mobility training;Therapeutic activities;Therapeutic exercise;Patient/family education;Neuromuscular re-education;Manual techniques;Taping;Dry needling    PT Next Visit Plan  review stretch, add core/hip stability exercises, assess response to traction, ?muscle energy techniques    Consulted and Agree with Plan of Care  Patient       Patient will benefit from skilled therapeutic intervention in order to improve the following deficits and impairments:  Increased fascial restricitons, Increased muscle spasms, Pain, Postural dysfunction, Impaired flexibility, Decreased strength, Decreased mobility, Decreased endurance  Visit Diagnosis: Chronic right-sided low back pain with right-sided sciatica - Plan: PT plan of care cert/re-cert  Abnormal posture - Plan: PT plan of care cert/re-cert     Problem List There are no active problems to display for this patient.     Laureen Abrahams, PT, DPT 10/31/17 4:48 PM    [facility] [address] [place], [place], [address] Phone: [phone]   Fax:  [phone]  Name: TRU LEOPARD MRN: [id number] Date of Birth: Feb 21, 1966

## 2017-11-04 ENCOUNTER — Ambulatory Visit: Payer: 59 | Admitting: Physical Therapy

## 2017-11-04 DIAGNOSIS — M5441 Lumbago with sciatica, right side: Principal | ICD-10-CM

## 2017-11-04 DIAGNOSIS — G8929 Other chronic pain: Secondary | ICD-10-CM | POA: Diagnosis not present

## 2017-11-04 DIAGNOSIS — R293 Abnormal posture: Secondary | ICD-10-CM

## 2017-11-04 NOTE — Therapy (Signed)
[place] [place], [place], [address] Phone: [phone]   Fax:  [phone]  Physical Therapy Treatment  Patient Details  Name: Kelsey Henderson MRN: [id number] Date of Birth: 1966-06-03 Referring Provider: Jefm Petty, MD   Encounter Date: 11/04/2017  PT End of Session - 11/04/17 1151    Visit Number  2    Number of Visits  12    Date for PT Re-Evaluation  12/12/17    Authorization Type  [employer] $20 copay    PT Start Time  1145    PT Stop Time  1225    PT Time Calculation (min)  40 min       Past Medical History:  Diagnosis Date  . Hyperlipidemia   . Migraines   . SVD (spontaneous vaginal delivery)    x 2    Past Surgical History:  Procedure Laterality Date  . CHOLECYSTECTOMY    . WISDOM TOOTH EXTRACTION      There were no vitals filed for this visit.  Subjective Assessment - 11/04/17 1149    Subjective  Pain was 10/10 this morning.     Currently in Pain?  Yes    Pain Score  6     Pain Location  Back    Pain Orientation  Right    Pain Descriptors / Indicators  Sharp;Stabbing                      [facility] Adult PT Treatment/Exercise - 11/04/17 0001      Lumbar Exercises: Stretches   Active Hamstring Stretch  3 reps;30 seconds    Active Hamstring Stretch Limitations  cues to keep neutral spine     Single Knee to Chest Stretch  3 reps;30 seconds    Piriformis Stretch  3 reps;30 seconds    Figure 4 Stretch  3 reps;30 seconds    Figure 4 Stretch Limitations  modified      Lumbar Exercises: Seated   Sit to Stand  10 reps without UE       Lumbar Exercises: Supine   Ab Set  10 reps;5 seconds    Pelvic Tilt  10 reps anterior and posterior     Clam  10 reps;20 reps    Clam Limitations  bilateral and unilateral- cues for breathing  and neutral spine.     Bridge  10 reps                  PT Long Term Goals - 10/31/17 1642      PT LONG TERM GOAL #1   Title   independent with HEP    Status  New    Target Date  12/12/17      PT LONG TERM GOAL #2   Title  verbalize understanding of posture/body mechanics/workstation set up to decrease risk of reinjury    Status  New    Target Date  12/12/17      PT LONG TERM GOAL #3   Title  report centralization of pain for improved symptoms and function    Status  New    Target Date  12/12/17      PT LONG TERM GOAL #4   Title  report ability to walk at least 30 min without increase in pain for improved function    Status  New    Target Date  12/12/17            Plan - 11/04/17  1211    Clinical Impression Statement  Pelvis appears level today. Sore after traction, no change in radicular sx. Had burning in leg over the weekend with standing activity. No radicular sx now. Reviewed hamstring stretch and she required cues for neutral spine. Began pelvic mobility and core stability exercises. Updated HEP. Pt reports feeling less pain at end of session.     PT Next Visit Plan  review stretch, add core/hip stability exercises, assess response to traction, ?muscle energy techniques    PT Home Exercise Plan  hamstring stretch seated , neutral spine clam yellow band biateral and unilateral, bridge, sit-stand.     Consulted and Agree with Plan of Care  Patient       Patient will benefit from skilled therapeutic intervention in order to improve the following deficits and impairments:  Increased fascial restricitons, Increased muscle spasms, Pain, Postural dysfunction, Impaired flexibility, Decreased strength, Decreased mobility, Decreased endurance  Visit Diagnosis: Chronic right-sided low back pain with right-sided sciatica  Abnormal posture     Problem List There are no active problems to display for this patient.   Dorene Ar, [place] 11/04/2017, 12:51 PM  [facility] [address] [place], [place], [address] Phone: [phone]   Fax:   [phone]  Name: ALFREDA HAMMAD MRN: [id number] Date of Birth: 1966/06/19

## 2017-11-12 ENCOUNTER — Encounter: Payer: Self-pay | Admitting: Physical Therapy

## 2017-11-12 ENCOUNTER — Ambulatory Visit (INDEPENDENT_AMBULATORY_CARE_PROVIDER_SITE_OTHER): Payer: Self-pay | Admitting: Emergency Medicine

## 2017-11-12 ENCOUNTER — Ambulatory Visit: Payer: 59 | Admitting: Physical Therapy

## 2017-11-12 VITALS — BP 124/78 | HR 54 | Temp 98.1°F | Resp 17

## 2017-11-12 DIAGNOSIS — M5441 Lumbago with sciatica, right side: Principal | ICD-10-CM

## 2017-11-12 DIAGNOSIS — R293 Abnormal posture: Secondary | ICD-10-CM | POA: Diagnosis not present

## 2017-11-12 DIAGNOSIS — J069 Acute upper respiratory infection, unspecified: Secondary | ICD-10-CM

## 2017-11-12 DIAGNOSIS — G8929 Other chronic pain: Secondary | ICD-10-CM

## 2017-11-12 DIAGNOSIS — B9789 Other viral agents as the cause of diseases classified elsewhere: Secondary | ICD-10-CM

## 2017-11-12 MED ORDER — BENZONATATE 100 MG PO CAPS
100.0000 mg | ORAL_CAPSULE | Freq: Three times a day (TID) | ORAL | 0 refills | Status: DC | PRN
Start: 2017-11-12 — End: 2018-02-14

## 2017-11-12 MED ORDER — FLUTICASONE PROPIONATE 50 MCG/ACT NA SUSP: 2.0000 | Freq: Two times a day (BID) | NASAL | 0 refills | Status: AC

## 2017-11-12 MED FILL — FLUTICASONE PROP 50 MCG SPR: 50 | 30 days supply | Qty: 16 | Fill #0

## 2017-11-12 MED FILL — BENZONATATE 100 MG CAP: 100 | 10 days supply | Qty: 30 | Fill #0

## 2017-11-12 NOTE — Therapy (Signed)
[place], [place], [address] Phone: [phone]   Fax:  [phone]  Physical Therapy Treatment  Patient Details  Name: Kelsey Henderson MRN: [id number] Date of Birth: May 13, 1966 Referring Provider: Jefm Petty, MD   Encounter Date: 11/12/2017  PT End of Session - 11/12/17 0814    Visit Number  3    Number of Visits  12    Date for PT Re-Evaluation  12/12/17    Authorization Type  [facility] $20 copay    PT Start Time  0800    PT Stop Time  0846    PT Time Calculation (min)  46 min       Past Medical History:  Diagnosis Date  . Hyperlipidemia   . Migraines   . SVD (spontaneous vaginal delivery)    x 2    Past Surgical History:  Procedure Laterality Date  . CHOLECYSTECTOMY    . WISDOM TOOTH EXTRACTION      There were no vitals filed for this visit.  Subjective Assessment - 11/12/17 0803    Subjective  I noticed pain shooting down to the knee when I woke up today.     Currently in Pain?  Yes    Pain Score  6     Pain Location  Back    Pain Orientation  Right    Pain Descriptors / Indicators  Aching    Pain Type  Chronic pain    Pain Radiating Towards  right knee                      [facility] Adult PT Treatment/Exercise - 11/12/17 0001      Lumbar Exercises: Stretches   Active Hamstring Stretch  3 reps;30 seconds    Single Knee to Chest Stretch  3 reps;30 seconds    Piriformis Stretch  3 reps;30 seconds    Figure 4 Stretch  3 reps;30 seconds    Figure 4 Stretch Limitations  modified      Lumbar Exercises: Seated   Sit to Stand  10 reps without UE       Lumbar Exercises: Supine   Clam  10 reps;20 reps    Clam Limitations  bilateral and unilateral- cues for breathing  and neutral spine.  green band     Bridge  10 reps    Bridge with [employer]  10 reps    Bridge with clamshell  10 reps green      Manual Therapy   Manual therapy comments  MET right hip flexion,  left hip extension             PT Education - 11/12/17 0845    Education provided  Yes    Education Details  HEP    Person(s) Educated  Patient    Methods  Explanation;Handout    Comprehension  Verbalized understanding          PT Long Term Goals - 10/31/17 1642      PT LONG TERM GOAL #1   Title  independent with HEP    Status  New    Target Date  12/12/17      PT LONG TERM GOAL #2   Title  verbalize understanding of posture/body mechanics/workstation set up to decrease risk of reinjury    Status  New    Target Date  12/12/17      PT LONG TERM GOAL #3   Title  report centralization  of pain for improved symptoms and function    Status  New    Target Date  12/12/17      PT LONG TERM GOAL #4   Title  report ability to walk at least 30 min without increase in pain for improved function    Status  New    Target Date  12/12/17            Plan - 11/12/17 0814    Clinical Impression Statement  Pt arrives reporting right low back pain that is not low in the gluteals. Also, notes right knee pain that is not radiating however is aching in anterior right knee. Right inominate rotation present today. After MET pelvis level and pt reports zero pain in right low back. Pt given instruction for self MET and progressed stabilization HEP.     PT Next Visit Plan  review stretch, add core/hip stability exercises, assess response to traction, ?muscle energy techniques    PT Home Exercise Plan  hamstring stretch seated , neutral spine clam yellow band biateral and unilateral, bridge, sit-stand, bridge with clams and ball squeeze, self MET prn    Consulted and Agree with Plan of Care  Patient       Patient will benefit from skilled therapeutic intervention in order to improve the following deficits and impairments:  Increased fascial restricitons, Increased muscle spasms, Pain, Postural dysfunction, Impaired flexibility, Decreased strength, Decreased mobility, Decreased  endurance  Visit Diagnosis: Chronic right-sided low back pain with right-sided sciatica  Abnormal posture     Problem List There are no active problems to display for this patient.   Dorene Ar, [place] 11/12/2017, 8:53 AM  [place], [place], [address] Phone: [phone]   Fax:  [phone]  Name: Kelsey Henderson MRN: [id number] Date of Birth: February 07, 1966

## 2017-11-12 NOTE — Progress Notes (Signed)
Subjective:     Kelsey Henderson is a 52 y.o. female who presents for evaluation of symptoms of a URI. Symptoms include congestion, coryza, nasal congestion, no  fever, sinus pressure and sneezing. Onset of symptoms was 4 days ago, and has been unchanged since that time. Treatment to date: Robtatussin DM. With moderate relief. Otherwise reports good health.     Review of Systems Pertinent items noted in HPI and remainder of comprehensive ROS otherwise negative.   Objective:    BP 124/78 (BP Location: Right Arm, Patient Position: Sitting, Cuff Size: Normal)   Pulse (!) 54   Temp 98.1 F (36.7 C) (Oral)   Resp 17   SpO2 99%  General appearance: alert, cooperative and appears stated age Head: Normocephalic, without obvious abnormality, atraumatic Ears: normal TM's and external ear canals both ears Nose: Nares normal. Septum midline. Mucosa normal. No drainage or sinus tenderness. Throat: lips, mucosa, and tongue normal; teeth and gums normal and no tonsilar erythema or exudate Neck: no adenopathy Lungs: clear to auscultation bilaterally Heart: regular rate and rhythm Abdomen: SNT Pulses: 2+ and symmetric Skin: Skin color, texture, turgor normal. No rashes or lesions   Assessment:    viral upper respiratory illness   Plan:    Discussed diagnosis and treatment of URI. Discussed the importance of avoiding unnecessary antibiotic therapy. Suggested symptomatic OTC remedies. Nasal saline spray for congestion. Nasal steroids per orders. Follow up as needed. Follow up in 1 week or as needed.

## 2017-11-12 NOTE — Patient Instructions (Signed)

## 2017-11-14 ENCOUNTER — Telehealth: Payer: Self-pay

## 2017-11-15 ENCOUNTER — Ambulatory Visit: Payer: 59 | Admitting: Physical Therapy

## 2017-11-15 ENCOUNTER — Encounter: Payer: Self-pay | Admitting: Physical Therapy

## 2017-11-15 DIAGNOSIS — R293 Abnormal posture: Secondary | ICD-10-CM | POA: Diagnosis not present

## 2017-11-15 DIAGNOSIS — G8929 Other chronic pain: Secondary | ICD-10-CM | POA: Diagnosis not present

## 2017-11-15 DIAGNOSIS — M5441 Lumbago with sciatica, right side: Secondary | ICD-10-CM | POA: Diagnosis not present

## 2017-11-15 NOTE — Therapy (Signed)
[place], [place], [address] Phone: [phone]   Fax:  [phone]  Physical Therapy Treatment  Patient Details  Name: AAIMA GADDIE MRN: [id number] Date of Birth: 1966-08-17 Referring Provider: Jefm Petty, MD   Encounter Date: 11/15/2017  PT End of Session - 11/15/17 1059    Visit Number  4    Number of Visits  12    Date for PT Re-Evaluation  12/12/17    Authorization Type  [facility] $20 copay    PT Start Time  1100    PT Stop Time  1141    PT Time Calculation (min)  41 min    Activity Tolerance  Patient tolerated treatment well    Behavior During Therapy  [facility] for tasks assessed/performed       Past Medical History:  Diagnosis Date  . Hyperlipidemia   . Migraines   . SVD (spontaneous vaginal delivery)    x 2    Past Surgical History:  Procedure Laterality Date  . CHOLECYSTECTOMY    . WISDOM TOOTH EXTRACTION      There were no vitals filed for this visit.  Subjective Assessment - 11/15/17 1059    Subjective  "the exercises are helping, pain is only a 1-2 today. yesterday I had no pain"     Currently in Pain?  Yes    Pain Score  1     Pain Orientation  Right    Aggravating Factors   brisk walk, turning the wrong way,                 No data recorded       [facility] Adult PT Treatment/Exercise - 11/15/17 1110      Lumbar Exercises: Stretches   Hip Flexor Stretch  30 seconds;3 reps standing with glute activation. given as HEP      Lumbar Exercises: Aerobic   Nustep  L6 x 5 min LE only       Lumbar Exercises: Supine   Bridge  10 reps x  2 sets      Knee/Hip Exercises: Seated   Hamstring Curl  2 sets;Right;Strengthening;10 reps with green theraband      Knee/Hip Exercises: Sidelying   Hip ABduction  2 sets;Strengthening;10 reps cues to avoid lifting leg too high to avoid pain      Knee/Hip Exercises: Prone   Hamstring Curl  2 sets;10 reps given as HEP      Manual Therapy   Manual Therapy  Soft tissue mobilization    Manual therapy comments  MTRP along proximal rectus femoris x 3    Soft tissue mobilization  IASTM over the R hip flexor.             PT Education - 11/15/17 1136    Education provided  Yes    Education Details  updated HEp     Person(s) Educated  Patient    Methods  Explanation;Verbal cues    Comprehension  Verbalized understanding;Verbal cues required          PT Long Term Goals - 10/31/17 1642      PT LONG TERM GOAL #1   Title  independent with HEP    Status  New    Target Date  12/12/17      PT LONG TERM GOAL #2   Title  verbalize understanding of posture/body mechanics/workstation set up to decrease risk of reinjury    Status  New  Target Date  12/12/17      PT LONG TERM GOAL #3   Title  report centralization of pain for improved symptoms and function    Status  New    Target Date  12/12/17      PT LONG TERM GOAL #4   Title  report ability to walk at least 30 min without increase in pain for improved function    Status  New    Target Date  12/12/17            Plan - 11/15/17 1139    Clinical Impression Statement  pt reports 1-2/10 pain intially today. utiliized MTPR techniques to reduce R hip flexor tightness followed with stretching. continued working on innominate anteror rotation with hip flexor stretching and hamstring activation. end of session she reported no pain in the hip or in the knee.     PT Next Visit Plan  review stretch, add core/hip stability exercises, assess response to traction, ?muscle energy techniques    PT Home Exercise Plan  hamstring stretch seated , neutral spine clam yellow band biateral and unilateral, bridge, sit-stand, bridge with clams and ball squeeze, self MET prn, prone hamstring curl, standing hip flexor stretching.     Consulted and Agree with Plan of Care  Patient       Patient will benefit from skilled therapeutic intervention in order to improve the  following deficits and impairments:  Increased fascial restricitons, Increased muscle spasms, Pain, Postural dysfunction, Impaired flexibility, Decreased strength, Decreased mobility, Decreased endurance  Visit Diagnosis: Chronic right-sided low back pain with right-sided sciatica  Abnormal posture     Problem List There are no active problems to display for this patient.   Starr Lake PT, DPT, LAT, ATC  11/15/17  11:43 AM       [facility] [address] [place], [place], [address] Phone: [phone]   Fax:  [phone]  Name: TARALYN FERRAIOLO MRN: [id number] Date of Birth: 06-05-1966

## 2017-11-18 ENCOUNTER — Encounter: Payer: Self-pay | Admitting: Physical Therapy

## 2017-11-18 ENCOUNTER — Ambulatory Visit: Payer: 59 | Admitting: Physical Therapy

## 2017-11-18 DIAGNOSIS — G8929 Other chronic pain: Secondary | ICD-10-CM

## 2017-11-18 DIAGNOSIS — R293 Abnormal posture: Secondary | ICD-10-CM | POA: Diagnosis not present

## 2017-11-18 DIAGNOSIS — M5441 Lumbago with sciatica, right side: Secondary | ICD-10-CM | POA: Diagnosis not present

## 2017-11-18 NOTE — Therapy (Signed)
[place], [place], [address] Phone: [phone]   Fax:  [phone]  Physical Therapy Treatment  Patient Details  Name: Kelsey Henderson MRN: [id number] Date of Birth: 1966-05-11 Referring Provider: Jefm Petty, MD   Encounter Date: 11/18/2017  PT End of Session - 11/18/17 0720    Visit Number  5    Number of Visits  12    Date for PT Re-Evaluation  12/12/17    Authorization Type  [facility] $20 copay    PT Start Time  0714    PT Stop Time  0752    PT Time Calculation (min)  38 min       Past Medical History:  Diagnosis Date  . Hyperlipidemia   . Migraines   . SVD (spontaneous vaginal delivery)    x 2    Past Surgical History:  Procedure Laterality Date  . CHOLECYSTECTOMY    . WISDOM TOOTH EXTRACTION      There were no vitals filed for this visit.  Subjective Assessment - 11/18/17 0720    Currently in Pain?  No/denies                No data recorded       [facility] Adult PT Treatment/Exercise - 11/18/17 0001      Lumbar Exercises: Stretches   Active Hamstring Stretch  3 reps;30 seconds    Single Knee to Chest Stretch  3 reps;30 seconds    Hip Flexor Stretch  30 seconds;3 reps standing with glute activation. given as HEP    Piriformis Stretch  3 reps;30 seconds    Figure 4 Stretch  3 reps;30 seconds      Lumbar Exercises: Aerobic   Nustep  L6 x 5 min LE only       Lumbar Exercises: Seated   Sit to Stand  10 reps without UE       Lumbar Exercises: Supine   Clam  10 reps;20 reps    Clam Limitations  bilateral and unilateral- cues for breathing  and neutral spine.  green band     Bridge  10 reps    Bridge with [employer]  10 reps    Bridge with clamshell  10 reps green      Knee/Hip Exercises: Sidelying   Hip ABduction  2 sets;Strengthening;10 reps cues to avoid lifting leg too high to avoid pain      Knee/Hip Exercises: Prone   Hamstring Curl  2 sets;10 reps  given as HEP                  PT Long Term Goals - 10/31/17 1642      PT LONG TERM GOAL #1   Title  independent with HEP    Status  New    Target Date  12/12/17      PT LONG TERM GOAL #2   Title  verbalize understanding of posture/body mechanics/workstation set up to decrease risk of reinjury    Status  New    Target Date  12/12/17      PT LONG TERM GOAL #3   Title  report centralization of pain for improved symptoms and function    Status  New    Target Date  12/12/17      PT LONG TERM GOAL #4   Title  report ability to walk at least 30 min without increase in pain for improved function    Status  New  Target Date  12/12/17            Plan - 11/18/17 0733    Clinical Impression Statement  Pt arrives with 0/10 pain. She had a little this morning resolved after stretching. She is ambulating for shopping without increased pain. She also reports doing housework without increased pain. Progressing toward LTG. We reviewed all HEP today without increased pain.     PT Next Visit Plan  review stretch, add core/hip stability exercises, assess response to traction, ?muscle energy techniques    PT Home Exercise Plan  hamstring stretch seated , neutral spine clam yellow band biateral and unilateral, bridge, sit-stand, bridge with clams and ball squeeze, self MET prn, prone hamstring curl, standing hip flexor stretching.     Consulted and Agree with Plan of Care  Patient       Patient will benefit from skilled therapeutic intervention in order to improve the following deficits and impairments:  Increased fascial restricitons, Increased muscle spasms, Pain, Postural dysfunction, Impaired flexibility, Decreased strength, Decreased mobility, Decreased endurance  Visit Diagnosis: Chronic right-sided low back pain with right-sided sciatica  Abnormal posture     Problem List There are no active problems to display for this patient.   Dorene Ar,  [place] 11/18/2017, 7:57 AM  [place], [place], [address] Phone: [phone]   Fax:  [phone]  Name: Kelsey Henderson MRN: [id number] Date of Birth: 10-27-1965

## 2017-11-20 ENCOUNTER — Ambulatory Visit: Payer: 59 | Admitting: Physical Therapy

## 2017-11-20 ENCOUNTER — Encounter: Payer: Self-pay | Admitting: Physical Therapy

## 2017-11-20 DIAGNOSIS — M5441 Lumbago with sciatica, right side: Principal | ICD-10-CM

## 2017-11-20 DIAGNOSIS — R293 Abnormal posture: Secondary | ICD-10-CM | POA: Diagnosis not present

## 2017-11-20 DIAGNOSIS — G8929 Other chronic pain: Secondary | ICD-10-CM | POA: Diagnosis not present

## 2017-11-20 NOTE — Therapy (Signed)
[place], [place], [address] Phone: [phone]   Fax:  [phone]  Physical Therapy Treatment  Patient Details  Name: Kelsey Henderson MRN: [id number] Date of Birth: 1965/12/23 Referring Provider: Jefm Petty, MD   Encounter Date: 11/20/2017  PT End of Session - 11/20/17 0853    Visit Number  6    Number of Visits  12    Date for PT Re-Evaluation  12/12/17    Authorization Type  [facility] $20 copay    PT Start Time  0850    PT Stop Time  0930    PT Time Calculation (min)  40 min       Past Medical History:  Diagnosis Date  . Hyperlipidemia   . Migraines   . SVD (spontaneous vaginal delivery)    x 2    Past Surgical History:  Procedure Laterality Date  . CHOLECYSTECTOMY    . WISDOM TOOTH EXTRACTION      There were no vitals filed for this visit.  Subjective Assessment - 11/20/17 0852    Subjective  No pain,, just pressure. I Walked on the treadmill 30 minutes and had 1/10 pain,better after stretching.     Currently in Pain?  No/denies                No data recorded       [facility] Adult PT Treatment/Exercise - 11/20/17 0001      Lumbar Exercises: Stretches   Hip Flexor Stretch  30 seconds;3 reps standing with glute activation. given as HEP    Piriformis Stretch  3 reps;30 seconds      Lumbar Exercises: Aerobic   Nustep  L6 x 5 min LE only       Lumbar Exercises: Seated   Sit to Stand  15 reps without UE       Lumbar Exercises: Supine   Clam  10 reps;20 reps    Clam Limitations  bilateral and unilateral- cues for breathing  and neutral spine.  green band     Heel Slides  10 reps    Bent Knee Raise  10 reps    Bridge  10 reps    Bridge with clamshell  10 reps green      Knee/Hip Exercises: Seated   Hamstring Curl  Strengthening;Right;Both;2 sets;10 reps with green theraband      Knee/Hip Exercises: Sidelying   Hip ABduction  2 sets;Strengthening;10 reps cues to  avoid lifting leg too high to avoid pain      Knee/Hip Exercises: Prone   Hamstring Curl  2 sets;10 reps given as HEP    Hip Extension  2 sets;10 reps                  PT Long Term Goals - 10/31/17 1642      PT LONG TERM GOAL #1   Title  independent with HEP    Status  New    Target Date  12/12/17      PT LONG TERM GOAL #2   Title  verbalize understanding of posture/body mechanics/workstation set up to decrease risk of reinjury    Status  New    Target Date  12/12/17      PT LONG TERM GOAL #3   Title  report centralization of pain for improved symptoms and function    Status  New    Target Date  12/12/17      PT LONG TERM GOAL #  4   Title  report ability to walk at least 30 min without increase in pain for improved function    Status  New    Target Date  12/12/17            Plan - 11/20/17 0854    Clinical Impression Statement  Pt reports intermittent radicular knee pain as well as intermittent low level back pain rated 1/10 at most. She walked on treadmill 30 minutes with 1/10 pain. Continued with hip and core stability. Progressed lower abdominal strength. Progressing well toward LTGs. Probable DC next week.     PT Next Visit Plan  review stretch, add core/hip stability exercises; probable DC soon. FOTO     PT Home Exercise Plan  hamstring stretch seated , neutral spine clam yellow band biateral and unilateral, bridge, sit-stand, bridge with clams and ball squeeze, self MET prn, prone hamstring curl, standing hip flexor stretching.     Consulted and Agree with Plan of Care  Patient       Patient will benefit from skilled therapeutic intervention in order to improve the following deficits and impairments:  Increased fascial restricitons, Increased muscle spasms, Pain, Postural dysfunction, Impaired flexibility, Decreased strength, Decreased mobility, Decreased endurance  Visit Diagnosis: Chronic right-sided low back pain with right-sided sciatica  Abnormal  posture     Problem List There are no active problems to display for this patient.   Hessie Diener [place], [place] 11/20/2017, 9:27 AM  [place], [place], [address] Phone: [phone]   Fax:  [phone]  Name: LORAL CAMPI MRN: [id number] Date of Birth: 29-Jun-1966

## 2017-11-26 ENCOUNTER — Ambulatory Visit: Payer: 59 | Attending: Family Medicine | Admitting: Physical Therapy

## 2017-11-26 ENCOUNTER — Encounter: Payer: Self-pay | Admitting: Physical Therapy

## 2017-11-26 DIAGNOSIS — R293 Abnormal posture: Secondary | ICD-10-CM | POA: Insufficient documentation

## 2017-11-26 DIAGNOSIS — G8929 Other chronic pain: Secondary | ICD-10-CM | POA: Diagnosis not present

## 2017-11-26 DIAGNOSIS — M5441 Lumbago with sciatica, right side: Secondary | ICD-10-CM | POA: Diagnosis not present

## 2017-11-26 NOTE — Therapy (Signed)
[place], [place], [address] Phone: [phone]   Fax:  [phone]  Physical Therapy Treatment / Discharge Summary  Patient Details  Name: Kelsey Henderson MRN: [id number] Date of Birth: February 08, 1966 Referring Provider: Jefm Petty, MD   Encounter Date: 11/26/2017  PT End of Session - 11/26/17 0802    Visit Number  7    Number of Visits  12    Date for PT Re-Evaluation  12/12/17    PT Start Time  0803    PT Stop Time  0844    PT Time Calculation (min)  41 min    Activity Tolerance  Patient tolerated treatment well    Behavior During Therapy  [facility] for tasks assessed/performed       Past Medical History:  Diagnosis Date  . Hyperlipidemia   . Migraines   . SVD (spontaneous vaginal delivery)    x 2    Past Surgical History:  Procedure Laterality Date  . CHOLECYSTECTOMY    . WISDOM TOOTH EXTRACTION      There were no vitals filed for this visit.  Subjective Assessment - 11/26/17 0802    Subjective  "I am doing really well, and the last time I had some pain when I was gardening"     Currently in Pain?  No/denies         [facility] PT Assessment - 11/26/17 2482      Observation/Other Assessments   Focus on Therapeutic Outcomes (FOTO)   6% limited      AROM   Lumbar Flexion  90    Lumbar Extension  26    Lumbar - Right Side Bend  25    Lumbar - Left Side Bend  25      Strength   Right Hip Extension  4/5    Right Hip ABduction  4+/5    Left Hip Extension  4/5    Left Hip ABduction  4+/5                   [facility] Adult PT Treatment/Exercise - 11/26/17 0001      Knee/Hip Exercises: Standing   Hip Abduction  2 sets;Stengthening;Both;10 reps;Knee straight with red theraband    Hip Extension  2 sets;Knee straight;10 reps;Stengthening;Both with red theraband      Knee/Hip Exercises: Seated   Other Seated Knee/Hip Exercises  seated pelvic tilt 2 x 10 holding 5 sec    Sit to Sand   2 sets;5 reps;without UE support with focus on slow controlled eccentrics             PT Education - 11/26/17 0839    Education provided  Yes    Education Details  reviewed pt progress, reviewed previously provided HEP and updated for standing hip strength. benefits of working into exercise and continuing by progressing reps/ sets or resistance to continue to challenge function and progress with endurance.     Person(s) Educated  Patient    Methods  Explanation;Verbal cues;Handout    Comprehension  Verbalized understanding;Verbal cues required          PT Long Term Goals - 11/26/17 0813      PT LONG TERM GOAL #1   Title  independent with HEP    Status  Achieved      PT LONG TERM GOAL #2   Title  verbalize understanding of posture/body mechanics/workstation set up to decrease risk of reinjury    Period  Weeks  Status  Achieved      PT LONG TERM GOAL #3   Title  report centralization of pain for improved symptoms and function    Period  Weeks    Status  Achieved      PT LONG TERM GOAL #4   Title  report ability to walk at least 30 min without increase in pain for improved function    Period  Weeks    Status  Achieved            Plan - 11/26/17 7793    Clinical Impression Statement  pt reports no pain for the last few days and when she had pain she adujsted her posture and the pain went away. reviewed exercises and updated for standing hip strength which she performed well and reported no pain. She has improved trunk mobility,  and bil LE strength, she met all goals and additionally reports she is pain free; pt is able to maintain and progress her current level of function and will be discharged today.     PT Treatment/Interventions  ADLs/Self Care Home Management;Cryotherapy;Electrical Stimulation;Ultrasound;Traction;Moist Heat;Stair training;Functional mobility training;Therapeutic activities;Therapeutic exercise;Patient/family education;Neuromuscular  re-education;Manual techniques;Taping;Dry needling    PT Next Visit Plan  D/C    PT Home Exercise Plan  hamstring stretch seated , neutral spine clam yellow band biateral and unilateral, bridge, sit-stand, bridge with clams and ball squeeze, self MET prn, prone hamstring curl, standing hip flexor stretching.     Consulted and Agree with Plan of Care  Patient       Patient will benefit from skilled therapeutic intervention in order to improve the following deficits and impairments:  Increased fascial restricitons, Increased muscle spasms, Pain, Postural dysfunction, Impaired flexibility, Decreased strength, Decreased mobility, Decreased endurance  Visit Diagnosis: Chronic right-sided low back pain with right-sided sciatica  Abnormal posture     Problem List There are no active problems to display for this patient.  Starr Lake PT, DPT, LAT, ATC  11/26/17  8:42 AM      [facility] [address] [place], [place], [address] Phone: [phone]   Fax:  [phone]  Name: Kelsey Henderson MRN: [id number] Date of Birth: [birth year]          PHYSICAL THERAPY DISCHARGE SUMMARY  Visits from Start of Care: 7  Current functional level related to goals / functional outcomes: See goals, FOTO 6% limited   Remaining deficits: Intermittent soreness that is controlled with stretching and exercise. See above assessment   Education / Equipment: HEP, posture, theraband,   Plan: Patient agrees to discharge.  Patient goals were met. Patient is being discharged due to meeting the stated rehab goals.  ?????         Kristoffer Leamon PT, DPT, LAT, ATC  11/26/17  8:43 AM

## 2017-11-28 ENCOUNTER — Ambulatory Visit: Payer: 59 | Admitting: Physical Therapy

## 2018-01-09 MED FILL — ATORVASTATIN 10 MG TABLET: 10 | 90 days supply | Qty: 90 | Fill #1

## 2018-01-20 MED FILL — TOPIRAMATE 50 MG TABLET: 50 | 90 days supply | Qty: 90 | Fill #1

## 2018-01-21 MED FILL — SUMATRIPTAN SUCC 100 MG TAB: 100 | 30 days supply | Qty: 9 | Fill #1

## 2018-02-14 ENCOUNTER — Encounter: Payer: Self-pay | Admitting: Medical

## 2018-02-14 ENCOUNTER — Ambulatory Visit (INDEPENDENT_AMBULATORY_CARE_PROVIDER_SITE_OTHER): Payer: Self-pay | Admitting: Medical

## 2018-02-14 VITALS — BP 106/82 | HR 58 | Temp 98.5°F | Resp 18 | Wt 195.6 lb

## 2018-02-14 DIAGNOSIS — B029 Zoster without complications: Secondary | ICD-10-CM

## 2018-02-14 MED ORDER — VALACYCLOVIR HCL 1 G PO TABS: ORAL_TABLET | ORAL | 0 refills | Status: AC

## 2018-02-14 MED FILL — valACYclovir HCL 1 GM TABS: 1 | 7 days supply | Qty: 21 | Fill #0

## 2018-02-14 NOTE — Progress Notes (Signed)
   Subjective:    Patient ID: Kelsey Henderson, female    DOB: 1966-07-13, 52 y.o.   MRN: [id number]  HPI 52 yo female in non acute distress.  Presents today with complaint of rash starting 5 days ago on right side of chest. She has been unders some stress  ( had daughter get married on  May 4th),  Painful 4/10. has been tired x 2 weeks. Stomach upset and diarrhea last week, that has now resolved She has had shingles once before and was treated with Valtrex. "This feels similar to that". In December 2014 had shingles after mother passed away and she  lost her job. Rash staying about the same, very red with bra rubbing on it and redness seems to increase at night. Itches slightly.  She denies fever or chills.  Blood pressure 106/82, pulse (!) 58, temperature 98.5 F (36.9 C), temperature source Oral, resp. rate 18, weight 195 lb 9.6 oz (88.7 kg). Review of Systems  Constitutional: Negative for chills and fever.  HENT: Positive for congestion ("allergy related") and sore throat (Last week with sore throat x 2 days also thought this was allergies). Negative for ear pain.   Eyes: Positive for itching (with sore throat and  nasal congestion  now all  resolved.). Negative for discharge.  Respiratory: Negative for cough and shortness of breath.   Cardiovascular: Negative for chest pain.  Gastrointestinal: Negative for abdominal pain.  Endocrine: Negative for polydipsia, polyphagia and polyuria.  Musculoskeletal: Negative for back pain and myalgias.  Skin: Positive for rash (painful).  Allergic/Immunologic: Positive for environmental allergies. Negative for food allergies and immunocompromised state.  Neurological: Negative for dizziness, speech difficulty and light-headedness.  Hematological: Negative for adenopathy.  Psychiatric/Behavioral: Negative for behavioral problems, self-injury and suicidal ideas.   Lorenza Evangelist is her primary doctor at [employer].    Hx of sister with fatty liver  disease who just passed away.  Objective:   Physical Exam  Constitutional: She is oriented to person, place, and time. She appears well-developed and well-nourished.  HENT:  Head: Normocephalic and atraumatic.  Eyes: Pupils are equal, round, and reactive to light. Conjunctivae and EOM are normal.  Pulmonary/Chest:      Neurological: She is alert and oriented to person, place, and time.  Skin: Skin is warm and dry. Rash noted. There is erythema.  Psychiatric: She has a normal mood and affect. Her behavior is normal. Judgment and thought content normal.  Nursing note and vitals reviewed.  Sitting comfortable on exam table. Obese    Located on the right side of trunk  with erythematous base and maculopapular rash,not vessicular at this time,  no axillary adenopathy. No sign of infection or discharge. Assessment & Plan:  Herpes Zoster Meds ordered this encounter  Medications  . valACYclovir (VALTREX) 1000 MG tablet    Sig: One tablet my mouth every  8 hours for  7 day    Dispense:  21 tablet    Refill:  0  To follow up with her doctor this week for a recheck. Use gauze padding on area so bra does not rub on area.  Patient verbalizes understanding and has no questions at discharge.

## 2018-02-14 NOTE — Patient Instructions (Signed)

## 2018-04-16 DIAGNOSIS — E785 Hyperlipidemia, unspecified: Secondary | ICD-10-CM | POA: Diagnosis not present

## 2018-04-17 DIAGNOSIS — G43009 Migraine without aura, not intractable, without status migrainosus: Secondary | ICD-10-CM | POA: Diagnosis not present

## 2018-04-17 DIAGNOSIS — E785 Hyperlipidemia, unspecified: Secondary | ICD-10-CM | POA: Diagnosis not present

## 2018-04-17 DIAGNOSIS — M791 Myalgia, unspecified site: Secondary | ICD-10-CM | POA: Diagnosis not present

## 2018-04-17 DIAGNOSIS — R232 Flushing: Secondary | ICD-10-CM | POA: Diagnosis not present

## 2018-04-17 MED FILL — TOPIRAMATE 50 MG TABLET: 50 | 90 days supply | Qty: 90 | Fill #0

## 2018-04-17 MED FILL — SUMAtriptan SUCCINATE 100 M: 100 | 30 days supply | Qty: 9 | Fill #0

## 2018-04-17 MED FILL — ROSUVASTATIN CALCIUM 10 MG: 10 | 90 days supply | Qty: 90 | Fill #0

## 2018-04-18 MED FILL — SHINGRIX 50 MCG SUS: 50 | 30 days supply | Qty: 1 | Fill #0

## 2018-04-29 DIAGNOSIS — Z1151 Encounter for screening for human papillomavirus (HPV): Secondary | ICD-10-CM | POA: Diagnosis not present

## 2018-04-29 DIAGNOSIS — E559 Vitamin D deficiency, unspecified: Secondary | ICD-10-CM | POA: Diagnosis not present

## 2018-04-29 DIAGNOSIS — N812 Incomplete uterovaginal prolapse: Secondary | ICD-10-CM | POA: Diagnosis not present

## 2018-04-29 DIAGNOSIS — Z01411 Encounter for gynecological examination (general) (routine) with abnormal findings: Secondary | ICD-10-CM | POA: Diagnosis not present

## 2018-04-29 DIAGNOSIS — Z8619 Personal history of other infectious and parasitic diseases: Secondary | ICD-10-CM | POA: Diagnosis not present

## 2018-04-29 DIAGNOSIS — R1031 Right lower quadrant pain: Secondary | ICD-10-CM | POA: Diagnosis not present

## 2018-04-29 DIAGNOSIS — E785 Hyperlipidemia, unspecified: Secondary | ICD-10-CM | POA: Diagnosis not present

## 2018-04-29 DIAGNOSIS — N951 Menopausal and female climacteric states: Secondary | ICD-10-CM | POA: Diagnosis not present

## 2018-04-29 DIAGNOSIS — N8110 Cystocele, unspecified: Secondary | ICD-10-CM | POA: Diagnosis not present

## 2018-04-29 DIAGNOSIS — Z79899 Other long term (current) drug therapy: Secondary | ICD-10-CM | POA: Diagnosis not present

## 2018-05-06 DIAGNOSIS — D251 Intramural leiomyoma of uterus: Secondary | ICD-10-CM | POA: Diagnosis not present

## 2018-05-06 DIAGNOSIS — R1031 Right lower quadrant pain: Secondary | ICD-10-CM | POA: Diagnosis not present

## 2018-05-06 DIAGNOSIS — N951 Menopausal and female climacteric states: Secondary | ICD-10-CM | POA: Diagnosis not present

## 2018-05-06 MED FILL — VENLAFAXINE HCL ER 37.5 MG: 37.5 | 30 days supply | Qty: 30 | Fill #0

## 2018-05-30 MED FILL — VENLAFAXINE HCL ER 37.5 MG: 37.5 | 30 days supply | Qty: 30 | Fill #1

## 2018-06-23 MED FILL — SHINGRIX 50 MCG SUS: 50 | 30 days supply | Qty: 1 | Fill #1

## 2018-07-03 MED FILL — VENLAFAXINE HCL ER 37.5 MG: 37.5 | 30 days supply | Qty: 30 | Fill #2

## 2018-07-09 ENCOUNTER — Other Ambulatory Visit: Payer: Self-pay | Admitting: Obstetrics and Gynecology

## 2018-07-09 DIAGNOSIS — Z1231 Encounter for screening mammogram for malignant neoplasm of breast: Secondary | ICD-10-CM

## 2018-07-22 MED FILL — ROSUVASTATIN CALCIUM 10 MG: 10 | 90 days supply | Qty: 90 | Fill #1

## 2018-07-22 MED FILL — TOPIRAMATE 50 MG TABLET: 50 | 90 days supply | Qty: 90 | Fill #1

## 2018-08-04 MED FILL — VENLAFAXINE HCL ER 37.5 MG: 37.5 | 30 days supply | Qty: 30 | Fill #3

## 2018-08-19 ENCOUNTER — Ambulatory Visit
Admission: RE | Admit: 2018-08-19 | Discharge: 2018-08-19 | Disposition: A | Discharge status: Home / Self Care | Payer: 59 | Source: Ambulatory Visit | Attending: Obstetrics and Gynecology | Admitting: Obstetrics and Gynecology

## 2018-08-19 DIAGNOSIS — Z1231 Encounter for screening mammogram for malignant neoplasm of breast: Secondary | ICD-10-CM | POA: Diagnosis not present

## 2018-09-03 MED FILL — VENLAFAXINE HCL ER 37.5 MG: 37.5 | 30 days supply | Qty: 30 | Fill #4

## 2018-09-29 MED FILL — VENLAFAXINE HCL ER 37.5 MG: 37.5 | 30 days supply | Qty: 30 | Fill #5

## 2018-10-18 DIAGNOSIS — Z0000 Encounter for general adult medical examination without abnormal findings: Secondary | ICD-10-CM | POA: Diagnosis not present

## 2018-10-18 DIAGNOSIS — E559 Vitamin D deficiency, unspecified: Secondary | ICD-10-CM | POA: Diagnosis not present

## 2018-10-21 DIAGNOSIS — E785 Hyperlipidemia, unspecified: Secondary | ICD-10-CM | POA: Diagnosis not present

## 2018-10-21 DIAGNOSIS — Z6833 Body mass index (BMI) 33.0-33.9, adult: Secondary | ICD-10-CM | POA: Diagnosis not present

## 2018-10-21 DIAGNOSIS — G43009 Migraine without aura, not intractable, without status migrainosus: Secondary | ICD-10-CM | POA: Diagnosis not present

## 2018-10-21 DIAGNOSIS — E559 Vitamin D deficiency, unspecified: Secondary | ICD-10-CM | POA: Diagnosis not present

## 2018-10-21 DIAGNOSIS — Z0000 Encounter for general adult medical examination without abnormal findings: Secondary | ICD-10-CM | POA: Diagnosis not present

## 2018-10-21 DIAGNOSIS — R232 Flushing: Secondary | ICD-10-CM | POA: Diagnosis not present

## 2018-10-21 DIAGNOSIS — E669 Obesity, unspecified: Secondary | ICD-10-CM | POA: Diagnosis not present

## 2018-10-21 MED FILL — TOPIRAMATE 50 MG TABLET: 50 | 90 days supply | Qty: 90 | Fill #0

## 2018-10-21 MED FILL — ROSUVASTATIN CALCIUM 10 MG: 10 | 90 days supply | Qty: 90 | Fill #0

## 2018-10-21 MED FILL — SUMAtriptan SUCCINATE 100 M: 100 | 30 days supply | Qty: 9 | Fill #0

## 2018-11-03 MED FILL — VENLAFAXINE HCL ER 37.5 MG: 37.5 | 30 days supply | Qty: 30 | Fill #6

## 2018-11-13 MED FILL — VENLAFAXINE HCL ER 37.5 MG: 37.5 | 30 days supply | Qty: 30 | Fill #7

## 2019-01-01 MED FILL — VENLAFAXINE HCL ER 37.5 MG: 37.5 | 30 days supply | Qty: 30 | Fill #8

## 2019-01-13 MED FILL — TOPIRAMATE 50 MG TABLET: 50 | 90 days supply | Qty: 90 | Fill #1

## 2019-01-13 MED FILL — ROSUVASTATIN CALCIUM 10 MG: 10 | 90 days supply | Qty: 90 | Fill #1

## 2019-01-23 DIAGNOSIS — H524 Presbyopia: Secondary | ICD-10-CM | POA: Diagnosis not present

## 2019-01-28 MED FILL — VENLAFAXINE HCL ER 37.5 MG: 37.5 | 30 days supply | Qty: 30 | Fill #9

## 2019-03-04 MED FILL — VENLAFAXINE HCL ER 37.5 MG: 37.5 | 30 days supply | Qty: 30 | Fill #10

## 2019-03-31 MED FILL — VENLAFAXINE HCL ER 37.5 MG: 37.5 | 30 days supply | Qty: 30 | Fill #11

## 2019-04-10 ENCOUNTER — Other Ambulatory Visit: Payer: Self-pay | Admitting: Obstetrics and Gynecology

## 2019-04-10 DIAGNOSIS — Z1231 Encounter for screening mammogram for malignant neoplasm of breast: Secondary | ICD-10-CM

## 2019-04-25 DIAGNOSIS — E785 Hyperlipidemia, unspecified: Secondary | ICD-10-CM | POA: Diagnosis not present

## 2019-04-30 DIAGNOSIS — E785 Hyperlipidemia, unspecified: Secondary | ICD-10-CM | POA: Diagnosis not present

## 2019-04-30 DIAGNOSIS — G43009 Migraine without aura, not intractable, without status migrainosus: Secondary | ICD-10-CM | POA: Diagnosis not present

## 2019-04-30 MED FILL — ROSUVASTATIN CALCIUM 10 MG: 10 | 90 days supply | Qty: 90 | Fill #0

## 2019-04-30 MED FILL — SUMATRIPTAN SUCC 100 MG TAB: 100 | 30 days supply | Qty: 9 | Fill #0

## 2019-04-30 MED FILL — TOPIRAMATE 50 MG TABLET: 50 | 90 days supply | Qty: 90 | Fill #0

## 2019-05-01 MED FILL — VENLAFAXINE HCL ER 37.5 MG: 37.5 | 30 days supply | Qty: 30 | Fill #0

## 2019-05-27 MED FILL — VENLAFAXINE HCL ER 37.5 MG: 37.5 | 30 days supply | Qty: 30 | Fill #1

## 2019-06-01 ENCOUNTER — Other Ambulatory Visit: Payer: Self-pay | Admitting: Obstetrics and Gynecology

## 2019-06-01 DIAGNOSIS — Z1231 Encounter for screening mammogram for malignant neoplasm of breast: Secondary | ICD-10-CM

## 2019-06-17 DIAGNOSIS — N951 Menopausal and female climacteric states: Secondary | ICD-10-CM | POA: Diagnosis not present

## 2019-06-17 DIAGNOSIS — Z01419 Encounter for gynecological examination (general) (routine) without abnormal findings: Secondary | ICD-10-CM | POA: Diagnosis not present

## 2019-06-19 ENCOUNTER — Ambulatory Visit
Admission: RE | Admit: 2019-06-19 | Discharge: 2019-06-19 | Disposition: A | Discharge status: Home / Self Care | Payer: 59 | Source: Ambulatory Visit | Attending: Obstetrics and Gynecology | Admitting: Obstetrics and Gynecology

## 2019-06-19 ENCOUNTER — Other Ambulatory Visit: Payer: Self-pay

## 2019-06-19 DIAGNOSIS — Z1231 Encounter for screening mammogram for malignant neoplasm of breast: Secondary | ICD-10-CM | POA: Diagnosis not present

## 2019-06-25 MED FILL — VENLAFAXINE HCL ER 37.5 MG: 37.5 | 30 days supply | Qty: 30 | Fill #2

## 2019-07-31 MED FILL — TOPIRAMATE 50 MG TABLET: 50 | 90 days supply | Qty: 90 | Fill #1

## 2019-07-31 MED FILL — VENLAFAXINE HCL ER 37.5 MG: 37.5 | 30 days supply | Qty: 30 | Fill #0

## 2019-07-31 MED FILL — ROSUVASTATIN CALCIUM 10 MG: 10 | 90 days supply | Qty: 90 | Fill #1

## 2019-08-31 MED FILL — VENLAFAXINE HCL ER 37.5 MG: 37.5 | 30 days supply | Qty: 30 | Fill #1

## 2019-10-01 MED FILL — VENLAFAXINE HCL ER 37.5 MG: 37.5 | 30 days supply | Qty: 30 | Fill #2

## 2019-10-06 NOTE — Telephone Encounter (Signed)
Error

## 2019-10-24 DIAGNOSIS — E559 Vitamin D deficiency, unspecified: Secondary | ICD-10-CM | POA: Diagnosis not present

## 2019-10-24 DIAGNOSIS — Z0000 Encounter for general adult medical examination without abnormal findings: Secondary | ICD-10-CM | POA: Diagnosis not present

## 2019-10-24 DIAGNOSIS — E785 Hyperlipidemia, unspecified: Secondary | ICD-10-CM | POA: Diagnosis not present

## 2019-10-29 DIAGNOSIS — E559 Vitamin D deficiency, unspecified: Secondary | ICD-10-CM | POA: Diagnosis not present

## 2019-10-29 DIAGNOSIS — G43009 Migraine without aura, not intractable, without status migrainosus: Secondary | ICD-10-CM | POA: Diagnosis not present

## 2019-10-29 DIAGNOSIS — M5441 Lumbago with sciatica, right side: Secondary | ICD-10-CM | POA: Diagnosis not present

## 2019-10-29 DIAGNOSIS — R5383 Other fatigue: Secondary | ICD-10-CM | POA: Diagnosis not present

## 2019-10-29 DIAGNOSIS — E669 Obesity, unspecified: Secondary | ICD-10-CM | POA: Diagnosis not present

## 2019-10-29 DIAGNOSIS — G8929 Other chronic pain: Secondary | ICD-10-CM | POA: Diagnosis not present

## 2019-10-29 DIAGNOSIS — E785 Hyperlipidemia, unspecified: Secondary | ICD-10-CM | POA: Diagnosis not present

## 2019-10-29 MED FILL — VENLAFAXINE HCL ER 37.5 MG: 37.5 | 30 days supply | Qty: 30 | Fill #0

## 2019-10-29 MED FILL — TOPIRAMATE 50 MG TABLET: 50 | 90 days supply | Qty: 90 | Fill #2

## 2019-10-29 MED FILL — ROSUVASTATIN CALCIUM 10 MG: 10 | 90 days supply | Qty: 90 | Fill #2

## 2019-10-29 MED FILL — SUMATRIPTAN SUCC 100 MG TAB: 100 | 30 days supply | Qty: 9 | Fill #0

## 2019-11-18 DIAGNOSIS — M5441 Lumbago with sciatica, right side: Secondary | ICD-10-CM | POA: Diagnosis not present

## 2019-11-18 DIAGNOSIS — M47817 Spondylosis without myelopathy or radiculopathy, lumbosacral region: Secondary | ICD-10-CM | POA: Diagnosis not present

## 2019-11-18 DIAGNOSIS — M5127 Other intervertebral disc displacement, lumbosacral region: Secondary | ICD-10-CM | POA: Diagnosis not present

## 2019-11-18 DIAGNOSIS — G8929 Other chronic pain: Secondary | ICD-10-CM | POA: Diagnosis not present

## 2019-11-18 DIAGNOSIS — M4807 Spinal stenosis, lumbosacral region: Secondary | ICD-10-CM | POA: Diagnosis not present

## 2019-11-26 MED FILL — GABAPENTIN 300 MG CAPSULE: 300 | 20 days supply | Qty: 60 | Fill #0

## 2019-11-30 MED FILL — VENLAFAXINE HCL ER 37.5 MG: 37.5 | 30 days supply | Qty: 30 | Fill #1

## 2019-12-29 MED FILL — VENLAFAXINE HCL ER 37.5 MG: 37.5 | 30 days supply | Qty: 30 | Fill #2

## 2020-01-19 MED FILL — GABAPENTIN 300 MG CAPSULE: 300 | 20 days supply | Qty: 60 | Fill #1

## 2020-01-19 MED FILL — TOPIRAMATE 50 MG TABLET: 50 | 90 days supply | Qty: 90 | Fill #0

## 2020-02-26 MED FILL — VENLAFAXINE HCL ER 37.5 MG: 37.5 | 30 days supply | Qty: 30 | Fill #4

## 2020-02-26 MED FILL — ROSUVASTATIN CALCIUM 10 MG: 10 | 90 days supply | Qty: 90 | Fill #0

## 2020-02-26 MED FILL — GABAPENTIN 300 MG CAPSULE: 300 | 20 days supply | Qty: 60 | Fill #2

## 2020-03-30 MED FILL — VENLAFAXINE HCL ER 37.5 MG: 37.5 | 30 days supply | Qty: 30 | Fill #5

## 2020-04-06 DIAGNOSIS — H524 Presbyopia: Secondary | ICD-10-CM | POA: Diagnosis not present

## 2020-04-22 ENCOUNTER — Telehealth: Payer: 59 | Admitting: Nurse Practitioner

## 2020-04-22 DIAGNOSIS — J019 Acute sinusitis, unspecified: Secondary | ICD-10-CM | POA: Diagnosis not present

## 2020-04-22 DIAGNOSIS — B9689 Other specified bacterial agents as the cause of diseases classified elsewhere: Secondary | ICD-10-CM | POA: Diagnosis not present

## 2020-04-22 MED ORDER — AMOXICILLIN-POT CLAVULANATE 875-125 MG PO TABS: 1.0000 | ORAL_TABLET | Freq: Two times a day (BID) | ORAL | 0 refills | Status: DC

## 2020-04-22 NOTE — Progress Notes (Signed)
We are sorry that you are not feeling well.  Here is how we plan to help!  Based on what you have shared with me it looks like you have sinusitis.  Sinusitis is inflammation and infection in the sinus cavities of the head.  Based on your presentation I believe you most likely have Acute Bacterial Sinusitis.  This is an infection caused by bacteria and is treated with antibiotics. I have prescribed Augmentin 875mg /125mg  one tablet twice daily with food, for 7 days. You may use an oral decongestant such as Mucinex D or if you have glaucoma or high blood pressure use plain Mucinex. Saline nasal spray help and can safely be used as often as needed for congestion.  If you develop worsening sinus pain, fever or notice severe headache and vision changes, or if symptoms are not better after completion of antibiotic, please schedule an appointment with a health care provider.    Sinus infections are not as easily transmitted as other respiratory infection, however we still recommend that you avoid close contact with loved ones, especially the very young and elderly.  Remember to wash your hands thoroughly throughout the day as this is the number one way to prevent the spread of infection!  Home Care:  Only take medications as instructed by your medical team.  Complete the entire course of an antibiotic.  Do not take these medications with alcohol.  A steam or ultrasonic humidifier can help congestion.  You can place a towel over your head and breathe in the steam from hot water coming from a faucet.  Avoid close contacts especially the very young and the elderly.  Cover your mouth when you cough or sneeze.  Always remember to wash your hands.  Get Help Right Away If:  You develop worsening fever or sinus pain.  You develop a severe head ache or visual changes.  Your symptoms persist after you have completed your treatment plan.  Make sure you  Understand these instructions.  Will watch your  condition.  Will get help right away if you are not doing well or get worse.  Your e-visit answers were reviewed by a board certified advanced clinical practitioner to complete your personal care plan.  Depending on the condition, your plan could have included both over the counter or prescription medications.  If there is a problem please reply  once you have received a response from your provider.  Your safety is important to [place].  If you have drug allergies check your prescription carefully.    You can use MyChart to ask questions about today's visit, request a non-urgent call back, or ask for a work or school excuse for 24 hours related to this e-Visit. If it has been greater than 24 hours you will need to follow up with your provider, or enter a new e-Visit to address those concerns.  You will get an e-mail in the next two days asking about your experience.  I hope that your e-visit has been valuable and will speed your recovery. Thank you for using e-visits.  I have spent at least 5 minutes reviewing and documenting in the patient's chart.

## 2020-04-25 ENCOUNTER — Other Ambulatory Visit: Payer: Self-pay | Admitting: Obstetrics and Gynecology

## 2020-04-25 DIAGNOSIS — Z1231 Encounter for screening mammogram for malignant neoplasm of breast: Secondary | ICD-10-CM

## 2020-04-26 ENCOUNTER — Other Ambulatory Visit (HOSPITAL_COMMUNITY): Payer: Self-pay | Admitting: Obstetrics and Gynecology

## 2020-04-26 MED FILL — VENLAFAXINE HCL ER 37.5 MG: 37.5 | 30 days supply | Qty: 30 | Fill #0

## 2020-04-26 MED FILL — TOPIRAMATE 50 MG TABLET: 50 | 90 days supply | Qty: 90 | Fill #1

## 2020-05-13 DIAGNOSIS — E559 Vitamin D deficiency, unspecified: Secondary | ICD-10-CM | POA: Diagnosis not present

## 2020-05-13 DIAGNOSIS — Z79899 Other long term (current) drug therapy: Secondary | ICD-10-CM | POA: Diagnosis not present

## 2020-05-13 DIAGNOSIS — Z0000 Encounter for general adult medical examination without abnormal findings: Secondary | ICD-10-CM | POA: Diagnosis not present

## 2020-05-13 DIAGNOSIS — G8929 Other chronic pain: Secondary | ICD-10-CM | POA: Diagnosis not present

## 2020-05-13 DIAGNOSIS — E785 Hyperlipidemia, unspecified: Secondary | ICD-10-CM | POA: Diagnosis not present

## 2020-05-13 DIAGNOSIS — Z6836 Body mass index (BMI) 36.0-36.9, adult: Secondary | ICD-10-CM | POA: Diagnosis not present

## 2020-05-13 DIAGNOSIS — M5441 Lumbago with sciatica, right side: Secondary | ICD-10-CM | POA: Diagnosis not present

## 2020-05-13 DIAGNOSIS — G43009 Migraine without aura, not intractable, without status migrainosus: Secondary | ICD-10-CM | POA: Diagnosis not present

## 2020-05-13 DIAGNOSIS — E669 Obesity, unspecified: Secondary | ICD-10-CM | POA: Diagnosis not present

## 2020-05-17 ENCOUNTER — Other Ambulatory Visit (HOSPITAL_COMMUNITY): Payer: Self-pay | Admitting: Family Medicine

## 2020-05-17 DIAGNOSIS — G8929 Other chronic pain: Secondary | ICD-10-CM | POA: Diagnosis not present

## 2020-05-17 DIAGNOSIS — G43009 Migraine without aura, not intractable, without status migrainosus: Secondary | ICD-10-CM | POA: Diagnosis not present

## 2020-05-17 DIAGNOSIS — Z6836 Body mass index (BMI) 36.0-36.9, adult: Secondary | ICD-10-CM | POA: Diagnosis not present

## 2020-05-17 DIAGNOSIS — Z0000 Encounter for general adult medical examination without abnormal findings: Secondary | ICD-10-CM | POA: Diagnosis not present

## 2020-05-17 DIAGNOSIS — E559 Vitamin D deficiency, unspecified: Secondary | ICD-10-CM | POA: Diagnosis not present

## 2020-05-17 DIAGNOSIS — E785 Hyperlipidemia, unspecified: Secondary | ICD-10-CM | POA: Diagnosis not present

## 2020-05-17 DIAGNOSIS — M5441 Lumbago with sciatica, right side: Secondary | ICD-10-CM | POA: Diagnosis not present

## 2020-05-17 DIAGNOSIS — E669 Obesity, unspecified: Secondary | ICD-10-CM | POA: Diagnosis not present

## 2020-05-17 MED FILL — ROSUVASTATIN CALCIUM 10 MG: 10 | 90 days supply | Qty: 90 | Fill #0

## 2020-05-17 MED FILL — GABAPENTIN 300 MG CAPSULE: 300 | 90 days supply | Qty: 90 | Fill #0

## 2020-05-17 MED FILL — SUMATRIPTAN SUCCINATE 100 M: 100 | 30 days supply | Qty: 9 | Fill #0

## 2020-05-31 MED FILL — VENLAFAXINE HCL ER 37.5 MG: 37.5 | 30 days supply | Qty: 30 | Fill #1

## 2020-06-13 ENCOUNTER — Other Ambulatory Visit: Payer: Self-pay

## 2020-06-13 ENCOUNTER — Ambulatory Visit
Admission: RE | Admit: 2020-06-13 | Discharge: 2020-06-13 | Disposition: A | Discharge status: Home / Self Care | Payer: 59 | Source: Ambulatory Visit

## 2020-06-13 DIAGNOSIS — Z1231 Encounter for screening mammogram for malignant neoplasm of breast: Secondary | ICD-10-CM | POA: Diagnosis not present

## 2020-06-29 MED FILL — VENLAFAXINE HCL ER 37.5 MG: 37.5 | 30 days supply | Qty: 30 | Fill #2

## 2020-07-07 ENCOUNTER — Other Ambulatory Visit (HOSPITAL_COMMUNITY): Payer: Self-pay | Admitting: Obstetrics and Gynecology

## 2020-07-07 DIAGNOSIS — Z1151 Encounter for screening for human papillomavirus (HPV): Secondary | ICD-10-CM | POA: Diagnosis not present

## 2020-07-07 DIAGNOSIS — Z8049 Family history of malignant neoplasm of other genital organs: Secondary | ICD-10-CM | POA: Diagnosis not present

## 2020-07-07 DIAGNOSIS — E559 Vitamin D deficiency, unspecified: Secondary | ICD-10-CM | POA: Diagnosis not present

## 2020-07-07 DIAGNOSIS — N951 Menopausal and female climacteric states: Secondary | ICD-10-CM | POA: Diagnosis not present

## 2020-07-07 DIAGNOSIS — Z01419 Encounter for gynecological examination (general) (routine) without abnormal findings: Secondary | ICD-10-CM | POA: Diagnosis not present

## 2020-07-07 DIAGNOSIS — R87615 Unsatisfactory cytologic smear of cervix: Secondary | ICD-10-CM | POA: Diagnosis not present

## 2020-07-25 MED FILL — TOPIRAMATE 50 MG TABLET: 50 | 90 days supply | Qty: 90 | Fill #0

## 2020-07-25 MED FILL — VENLAFAXINE HCL ER 37.5 MG: 37.5 | 90 days supply | Qty: 90 | Fill #0

## 2020-08-15 MED FILL — GABAPENTIN 300 MG CAPSULE: 300 | 90 days supply | Qty: 90 | Fill #1

## 2020-09-01 MED FILL — ROSUVASTATIN CALCIUM 10 MG: 10 | 90 days supply | Qty: 90 | Fill #1

## 2020-10-28 MED FILL — TOPIRAMATE 50 MG TABLET: 50 | 90 days supply | Qty: 90 | Fill #1

## 2020-10-28 MED FILL — VENLAFAXINE HCL ER 37.5 MG: 37.5 | 90 days supply | Qty: 90 | Fill #1

## 2020-11-14 ENCOUNTER — Other Ambulatory Visit (HOSPITAL_COMMUNITY): Payer: Self-pay | Admitting: Family Medicine

## 2020-11-14 DIAGNOSIS — G8929 Other chronic pain: Secondary | ICD-10-CM | POA: Diagnosis not present

## 2020-11-14 DIAGNOSIS — G43009 Migraine without aura, not intractable, without status migrainosus: Secondary | ICD-10-CM | POA: Diagnosis not present

## 2020-11-14 DIAGNOSIS — M5441 Lumbago with sciatica, right side: Secondary | ICD-10-CM | POA: Diagnosis not present

## 2020-11-14 DIAGNOSIS — E669 Obesity, unspecified: Secondary | ICD-10-CM | POA: Diagnosis not present

## 2020-11-14 MED FILL — GABAPENTIN 300 MG CAPSULE: 300 | 90 days supply | Qty: 90 | Fill #0

## 2020-11-18 ENCOUNTER — Other Ambulatory Visit (HOSPITAL_BASED_OUTPATIENT_CLINIC_OR_DEPARTMENT_OTHER): Payer: Self-pay

## 2020-12-05 ENCOUNTER — Other Ambulatory Visit (HOSPITAL_COMMUNITY): Payer: Self-pay

## 2020-12-05 MED FILL — Rosuvastatin Calcium Tab 10 MG: ORAL | 90 days supply | Qty: 90 | Fill #0 | Status: AC

## 2021-01-26 ENCOUNTER — Other Ambulatory Visit (HOSPITAL_COMMUNITY): Payer: Self-pay

## 2021-01-26 MED FILL — Venlafaxine HCl Cap ER 24HR 37.5 MG (Base Equivalent): ORAL | 90 days supply | Qty: 90 | Fill #0 | Status: AC

## 2021-01-27 ENCOUNTER — Other Ambulatory Visit (HOSPITAL_COMMUNITY): Payer: Self-pay

## 2021-01-27 MED FILL — Topiramate Tab 50 MG: ORAL | 90 days supply | Qty: 90 | Fill #0 | Status: AC

## 2021-02-13 ENCOUNTER — Other Ambulatory Visit (HOSPITAL_COMMUNITY): Payer: Self-pay

## 2021-02-13 MED FILL — Gabapentin Cap 300 MG: ORAL | 90 days supply | Qty: 90 | Fill #0 | Status: AC

## 2021-03-03 ENCOUNTER — Other Ambulatory Visit (HOSPITAL_COMMUNITY): Payer: Self-pay

## 2021-03-03 MED FILL — Rosuvastatin Calcium Tab 10 MG: ORAL | 90 days supply | Qty: 90 | Fill #1 | Status: AC

## 2021-04-06 ENCOUNTER — Telehealth: Payer: 59 | Admitting: Emergency Medicine

## 2021-04-06 DIAGNOSIS — J329 Chronic sinusitis, unspecified: Secondary | ICD-10-CM | POA: Diagnosis not present

## 2021-04-06 MED ORDER — AMOXICILLIN-POT CLAVULANATE 875-125 MG PO TABS: 1.0000 | ORAL_TABLET | Freq: Two times a day (BID) | ORAL | 0 refills | Status: AC

## 2021-04-06 NOTE — Progress Notes (Signed)

## 2021-04-18 DIAGNOSIS — H524 Presbyopia: Secondary | ICD-10-CM | POA: Diagnosis not present

## 2021-04-25 ENCOUNTER — Other Ambulatory Visit (HOSPITAL_COMMUNITY): Payer: Self-pay

## 2021-04-25 MED FILL — Venlafaxine HCl Cap ER 24HR 37.5 MG (Base Equivalent): ORAL | 90 days supply | Qty: 90 | Fill #1 | Status: AC

## 2021-04-25 MED FILL — Topiramate Tab 50 MG: ORAL | 90 days supply | Qty: 90 | Fill #1 | Status: AC

## 2021-05-15 ENCOUNTER — Other Ambulatory Visit (HOSPITAL_COMMUNITY): Payer: Self-pay

## 2021-05-15 ENCOUNTER — Other Ambulatory Visit: Payer: Self-pay | Admitting: Obstetrics and Gynecology

## 2021-05-15 DIAGNOSIS — Z1231 Encounter for screening mammogram for malignant neoplasm of breast: Secondary | ICD-10-CM

## 2021-05-15 MED ORDER — GABAPENTIN 300 MG PO CAPS
ORAL_CAPSULE | ORAL | 0 refills | Status: DC
  Filled 2021-05-15: qty 30, 30d supply, fill #0

## 2021-05-17 ENCOUNTER — Other Ambulatory Visit (HOSPITAL_COMMUNITY): Payer: Self-pay

## 2021-05-19 DIAGNOSIS — Z6830 Body mass index (BMI) 30.0-30.9, adult: Secondary | ICD-10-CM | POA: Diagnosis not present

## 2021-05-19 DIAGNOSIS — G8929 Other chronic pain: Secondary | ICD-10-CM | POA: Diagnosis not present

## 2021-05-19 DIAGNOSIS — M5441 Lumbago with sciatica, right side: Secondary | ICD-10-CM | POA: Diagnosis not present

## 2021-05-19 DIAGNOSIS — E559 Vitamin D deficiency, unspecified: Secondary | ICD-10-CM | POA: Diagnosis not present

## 2021-05-19 DIAGNOSIS — Z79899 Other long term (current) drug therapy: Secondary | ICD-10-CM | POA: Diagnosis not present

## 2021-05-19 DIAGNOSIS — E785 Hyperlipidemia, unspecified: Secondary | ICD-10-CM | POA: Diagnosis not present

## 2021-05-19 DIAGNOSIS — G43009 Migraine without aura, not intractable, without status migrainosus: Secondary | ICD-10-CM | POA: Diagnosis not present

## 2021-05-19 DIAGNOSIS — E669 Obesity, unspecified: Secondary | ICD-10-CM | POA: Diagnosis not present

## 2021-05-19 DIAGNOSIS — Z0000 Encounter for general adult medical examination without abnormal findings: Secondary | ICD-10-CM | POA: Diagnosis not present

## 2021-05-22 ENCOUNTER — Other Ambulatory Visit (HOSPITAL_COMMUNITY): Payer: Self-pay

## 2021-05-22 DIAGNOSIS — E559 Vitamin D deficiency, unspecified: Secondary | ICD-10-CM | POA: Diagnosis not present

## 2021-05-22 DIAGNOSIS — N951 Menopausal and female climacteric states: Secondary | ICD-10-CM | POA: Diagnosis not present

## 2021-05-22 DIAGNOSIS — G43009 Migraine without aura, not intractable, without status migrainosus: Secondary | ICD-10-CM | POA: Diagnosis not present

## 2021-05-22 DIAGNOSIS — E785 Hyperlipidemia, unspecified: Secondary | ICD-10-CM | POA: Diagnosis not present

## 2021-05-22 DIAGNOSIS — Z23 Encounter for immunization: Secondary | ICD-10-CM | POA: Diagnosis not present

## 2021-05-22 DIAGNOSIS — Z0000 Encounter for general adult medical examination without abnormal findings: Secondary | ICD-10-CM | POA: Diagnosis not present

## 2021-05-22 DIAGNOSIS — E669 Obesity, unspecified: Secondary | ICD-10-CM | POA: Diagnosis not present

## 2021-05-22 DIAGNOSIS — M533 Sacrococcygeal disorders, not elsewhere classified: Secondary | ICD-10-CM | POA: Diagnosis not present

## 2021-05-22 DIAGNOSIS — M5441 Lumbago with sciatica, right side: Secondary | ICD-10-CM | POA: Diagnosis not present

## 2021-05-22 MED ORDER — ROSUVASTATIN CALCIUM 10 MG PO TABS
ORAL_TABLET | ORAL | 1 refills | Status: DC
  Filled 2021-05-22: qty 90, 90d supply, fill #0
  Filled 2021-09-08: qty 90, 90d supply, fill #1

## 2021-05-22 MED ORDER — GABAPENTIN 300 MG PO CAPS
ORAL_CAPSULE | ORAL | 1 refills | Status: DC
  Filled 2021-06-15: qty 90, 90d supply, fill #0
  Filled 2021-09-08: qty 90, 90d supply, fill #1

## 2021-05-22 MED ORDER — TOPIRAMATE 50 MG PO TABS
ORAL_TABLET | ORAL | 1 refills | Status: DC
  Filled 2021-07-27: qty 90, 90d supply, fill #0
  Filled 2021-10-24: qty 90, 90d supply, fill #1

## 2021-05-22 MED ORDER — SUMATRIPTAN SUCCINATE 100 MG PO TABS
ORAL_TABLET | ORAL | 5 refills | Status: AC
  Filled 2021-05-22: qty 9, 30d supply, fill #0

## 2021-06-14 ENCOUNTER — Other Ambulatory Visit: Payer: Self-pay

## 2021-06-14 ENCOUNTER — Ambulatory Visit
Admission: RE | Admit: 2021-06-14 | Discharge: 2021-06-14 | Disposition: A | Discharge status: Home / Self Care | Payer: 59 | Source: Ambulatory Visit

## 2021-06-14 DIAGNOSIS — Z1231 Encounter for screening mammogram for malignant neoplasm of breast: Secondary | ICD-10-CM

## 2021-06-15 ENCOUNTER — Other Ambulatory Visit (HOSPITAL_COMMUNITY): Payer: Self-pay

## 2021-07-12 ENCOUNTER — Other Ambulatory Visit (HOSPITAL_COMMUNITY): Payer: Self-pay

## 2021-07-12 DIAGNOSIS — Z803 Family history of malignant neoplasm of breast: Secondary | ICD-10-CM | POA: Diagnosis not present

## 2021-07-12 DIAGNOSIS — Z1151 Encounter for screening for human papillomavirus (HPV): Secondary | ICD-10-CM | POA: Diagnosis not present

## 2021-07-12 DIAGNOSIS — N952 Postmenopausal atrophic vaginitis: Secondary | ICD-10-CM | POA: Diagnosis not present

## 2021-07-12 DIAGNOSIS — N8189 Other female genital prolapse: Secondary | ICD-10-CM | POA: Diagnosis not present

## 2021-07-12 DIAGNOSIS — R87615 Unsatisfactory cytologic smear of cervix: Secondary | ICD-10-CM | POA: Diagnosis not present

## 2021-07-12 DIAGNOSIS — Z01419 Encounter for gynecological examination (general) (routine) without abnormal findings: Secondary | ICD-10-CM | POA: Diagnosis not present

## 2021-07-12 DIAGNOSIS — Z8049 Family history of malignant neoplasm of other genital organs: Secondary | ICD-10-CM | POA: Diagnosis not present

## 2021-07-12 DIAGNOSIS — Z01411 Encounter for gynecological examination (general) (routine) with abnormal findings: Secondary | ICD-10-CM | POA: Diagnosis not present

## 2021-07-12 DIAGNOSIS — N951 Menopausal and female climacteric states: Secondary | ICD-10-CM | POA: Diagnosis not present

## 2021-07-12 DIAGNOSIS — Z79899 Other long term (current) drug therapy: Secondary | ICD-10-CM | POA: Diagnosis not present

## 2021-07-12 DIAGNOSIS — R232 Flushing: Secondary | ICD-10-CM | POA: Diagnosis not present

## 2021-07-12 MED ORDER — NYSTATIN 100000 UNIT/GM EX POWD
CUTANEOUS | 5 refills | Status: AC
  Filled 2021-07-12: qty 60, 20d supply, fill #0

## 2021-07-12 MED ORDER — VENLAFAXINE HCL ER 37.5 MG PO CP24
ORAL_CAPSULE | ORAL | 3 refills | Status: DC
  Filled 2021-07-12: qty 90, 90d supply, fill #0
  Filled 2021-10-24: qty 90, 90d supply, fill #1
  Filled 2022-01-23: qty 90, 90d supply, fill #2
  Filled 2022-04-23: qty 90, 90d supply, fill #3

## 2021-07-27 ENCOUNTER — Other Ambulatory Visit (HOSPITAL_COMMUNITY): Payer: Self-pay

## 2021-09-08 ENCOUNTER — Other Ambulatory Visit (HOSPITAL_COMMUNITY): Payer: Self-pay

## 2021-10-24 ENCOUNTER — Other Ambulatory Visit (HOSPITAL_COMMUNITY): Payer: Self-pay

## 2021-11-19 DIAGNOSIS — E785 Hyperlipidemia, unspecified: Secondary | ICD-10-CM | POA: Diagnosis not present

## 2021-11-19 DIAGNOSIS — E559 Vitamin D deficiency, unspecified: Secondary | ICD-10-CM | POA: Diagnosis not present

## 2021-11-20 ENCOUNTER — Other Ambulatory Visit (HOSPITAL_COMMUNITY): Payer: Self-pay

## 2021-11-20 DIAGNOSIS — M5441 Lumbago with sciatica, right side: Secondary | ICD-10-CM | POA: Diagnosis not present

## 2021-11-20 DIAGNOSIS — E785 Hyperlipidemia, unspecified: Secondary | ICD-10-CM | POA: Diagnosis not present

## 2021-11-20 DIAGNOSIS — E559 Vitamin D deficiency, unspecified: Secondary | ICD-10-CM | POA: Diagnosis not present

## 2021-11-20 DIAGNOSIS — G43009 Migraine without aura, not intractable, without status migrainosus: Secondary | ICD-10-CM | POA: Diagnosis not present

## 2021-11-20 DIAGNOSIS — G8929 Other chronic pain: Secondary | ICD-10-CM | POA: Diagnosis not present

## 2021-11-20 MED ORDER — GABAPENTIN 300 MG PO CAPS
ORAL_CAPSULE | ORAL | 1 refills | Status: DC
  Filled 2021-11-20 – 2021-12-07 (×2): qty 90, 90d supply, fill #0
  Filled 2022-03-12: qty 90, 90d supply, fill #1

## 2021-11-20 MED ORDER — SUMATRIPTAN SUCCINATE 100 MG PO TABS
ORAL_TABLET | ORAL | 5 refills | Status: AC
  Filled 2021-11-20: qty 9, 30d supply, fill #0

## 2021-11-20 MED ORDER — TOPIRAMATE 50 MG PO TABS
ORAL_TABLET | ORAL | 1 refills | Status: DC
  Filled 2021-11-20 – 2022-01-23 (×2): qty 90, 90d supply, fill #0
  Filled 2022-04-23: qty 90, 90d supply, fill #1

## 2021-11-20 MED ORDER — ROSUVASTATIN CALCIUM 10 MG PO TABS
ORAL_TABLET | ORAL | 1 refills | Status: DC
  Filled 2021-11-20: qty 90, 90d supply, fill #0
  Filled 2022-03-12: qty 90, 90d supply, fill #1

## 2021-11-21 ENCOUNTER — Other Ambulatory Visit (HOSPITAL_COMMUNITY): Payer: Self-pay

## 2021-12-07 ENCOUNTER — Other Ambulatory Visit (HOSPITAL_COMMUNITY): Payer: Self-pay

## 2022-01-23 ENCOUNTER — Other Ambulatory Visit (HOSPITAL_COMMUNITY): Payer: Self-pay

## 2022-03-12 ENCOUNTER — Other Ambulatory Visit (HOSPITAL_COMMUNITY): Payer: Self-pay

## 2022-03-14 ENCOUNTER — Other Ambulatory Visit (HOSPITAL_COMMUNITY): Payer: Self-pay

## 2022-04-23 ENCOUNTER — Other Ambulatory Visit (HOSPITAL_COMMUNITY): Payer: Self-pay

## 2022-05-05 DIAGNOSIS — Z1329 Encounter for screening for other suspected endocrine disorder: Secondary | ICD-10-CM | POA: Diagnosis not present

## 2022-05-05 DIAGNOSIS — Z136 Encounter for screening for cardiovascular disorders: Secondary | ICD-10-CM | POA: Diagnosis not present

## 2022-05-05 DIAGNOSIS — E559 Vitamin D deficiency, unspecified: Secondary | ICD-10-CM | POA: Diagnosis not present

## 2022-05-05 DIAGNOSIS — Z130 Encounter for screening for diseases of the blood and blood-forming organs and certain disorders involving the immune mechanism: Secondary | ICD-10-CM | POA: Diagnosis not present

## 2022-05-05 DIAGNOSIS — Z13228 Encounter for screening for other metabolic disorders: Secondary | ICD-10-CM | POA: Diagnosis not present

## 2022-05-05 DIAGNOSIS — Z0000 Encounter for general adult medical examination without abnormal findings: Secondary | ICD-10-CM | POA: Diagnosis not present

## 2022-05-29 ENCOUNTER — Other Ambulatory Visit (HOSPITAL_COMMUNITY): Payer: Self-pay

## 2022-05-29 DIAGNOSIS — G8929 Other chronic pain: Secondary | ICD-10-CM | POA: Diagnosis not present

## 2022-05-29 DIAGNOSIS — M5441 Lumbago with sciatica, right side: Secondary | ICD-10-CM | POA: Diagnosis not present

## 2022-05-29 DIAGNOSIS — Z0000 Encounter for general adult medical examination without abnormal findings: Secondary | ICD-10-CM | POA: Diagnosis not present

## 2022-05-29 DIAGNOSIS — E785 Hyperlipidemia, unspecified: Secondary | ICD-10-CM | POA: Diagnosis not present

## 2022-05-29 DIAGNOSIS — G43009 Migraine without aura, not intractable, without status migrainosus: Secondary | ICD-10-CM | POA: Diagnosis not present

## 2022-05-29 MED ORDER — GABAPENTIN 100 MG PO CAPS
100.0000 mg | ORAL_CAPSULE | Freq: Two times a day (BID) | ORAL | 1 refills | Status: AC
  Filled 2022-05-29: qty 90, 45d supply, fill #0
  Filled 2022-07-23: qty 90, 45d supply, fill #1

## 2022-05-31 ENCOUNTER — Other Ambulatory Visit (HOSPITAL_COMMUNITY): Payer: Self-pay

## 2022-05-31 MED ORDER — GABAPENTIN 300 MG PO CAPS
300.0000 mg | ORAL_CAPSULE | Freq: Every evening | ORAL | 1 refills | Status: DC
  Filled 2022-05-31: qty 90, 90d supply, fill #0
  Filled 2022-09-11: qty 90, 90d supply, fill #1

## 2022-05-31 MED ORDER — ROSUVASTATIN CALCIUM 10 MG PO TABS
10.0000 mg | ORAL_TABLET | Freq: Every evening | ORAL | 1 refills | Status: DC
  Filled 2022-05-31: qty 90, 90d supply, fill #0
  Filled 2022-09-11: qty 90, 90d supply, fill #1

## 2022-05-31 MED ORDER — TOPIRAMATE 50 MG PO TABS
50.0000 mg | ORAL_TABLET | Freq: Every evening | ORAL | 1 refills | Status: DC
  Filled 2022-05-31 – 2022-07-23 (×2): qty 90, 90d supply, fill #0
  Filled 2022-10-17: qty 90, 90d supply, fill #1

## 2022-05-31 MED ORDER — SUMATRIPTAN SUCCINATE 100 MG PO TABS
100.0000 mg | ORAL_TABLET | ORAL | 5 refills | Status: AC
  Filled 2022-05-31: qty 10, 35d supply, fill #0

## 2022-07-23 ENCOUNTER — Other Ambulatory Visit (HOSPITAL_COMMUNITY): Payer: Self-pay

## 2022-07-23 MED ORDER — VENLAFAXINE HCL ER 37.5 MG PO CP24
37.5000 mg | ORAL_CAPSULE | Freq: Every evening | ORAL | 0 refills | Status: AC
  Filled 2022-07-23: qty 14, 14d supply, fill #0

## 2022-07-25 ENCOUNTER — Other Ambulatory Visit: Payer: Self-pay | Admitting: Obstetrics and Gynecology

## 2022-07-25 DIAGNOSIS — Z1231 Encounter for screening mammogram for malignant neoplasm of breast: Secondary | ICD-10-CM

## 2022-07-26 ENCOUNTER — Ambulatory Visit
Admission: RE | Admit: 2022-07-26 | Discharge: 2022-07-26 | Disposition: A | Discharge status: Home / Self Care | Payer: 59 | Source: Ambulatory Visit | Attending: Obstetrics and Gynecology | Admitting: Obstetrics and Gynecology

## 2022-07-26 DIAGNOSIS — Z1231 Encounter for screening mammogram for malignant neoplasm of breast: Secondary | ICD-10-CM | POA: Diagnosis not present

## 2022-08-08 ENCOUNTER — Other Ambulatory Visit (HOSPITAL_COMMUNITY): Payer: Self-pay

## 2022-08-08 DIAGNOSIS — Z01419 Encounter for gynecological examination (general) (routine) without abnormal findings: Secondary | ICD-10-CM | POA: Diagnosis not present

## 2022-08-08 MED ORDER — VENLAFAXINE HCL ER 37.5 MG PO CP24
37.5000 mg | ORAL_CAPSULE | Freq: Every evening | ORAL | 3 refills | Status: AC
  Filled 2022-08-08 (×2): qty 90, 90d supply, fill #0

## 2022-08-08 MED ORDER — NYSTATIN 100000 UNIT/GM EX POWD
1.0000 | Freq: Two times a day (BID) | CUTANEOUS | 5 refills | Status: AC
  Filled 2022-08-08 (×2): qty 60, 30d supply, fill #0

## 2022-08-28 ENCOUNTER — Other Ambulatory Visit (HOSPITAL_COMMUNITY): Payer: Self-pay

## 2022-09-11 ENCOUNTER — Other Ambulatory Visit (HOSPITAL_COMMUNITY): Payer: Self-pay

## 2022-10-16 DIAGNOSIS — H524 Presbyopia: Secondary | ICD-10-CM | POA: Diagnosis not present

## 2022-10-16 IMAGING — MG DIGITAL SCREENING BILAT W/ TOMO W/ CAD
8 series · 8 of 24 positions shown · non-contrast
Comparison: Previous exam(s).

CLINICAL DATA: Screening.

EXAM:
DIGITAL SCREENING BILATERAL MAMMOGRAM WITH TOMO AND CAD

[R CC synth-2D]
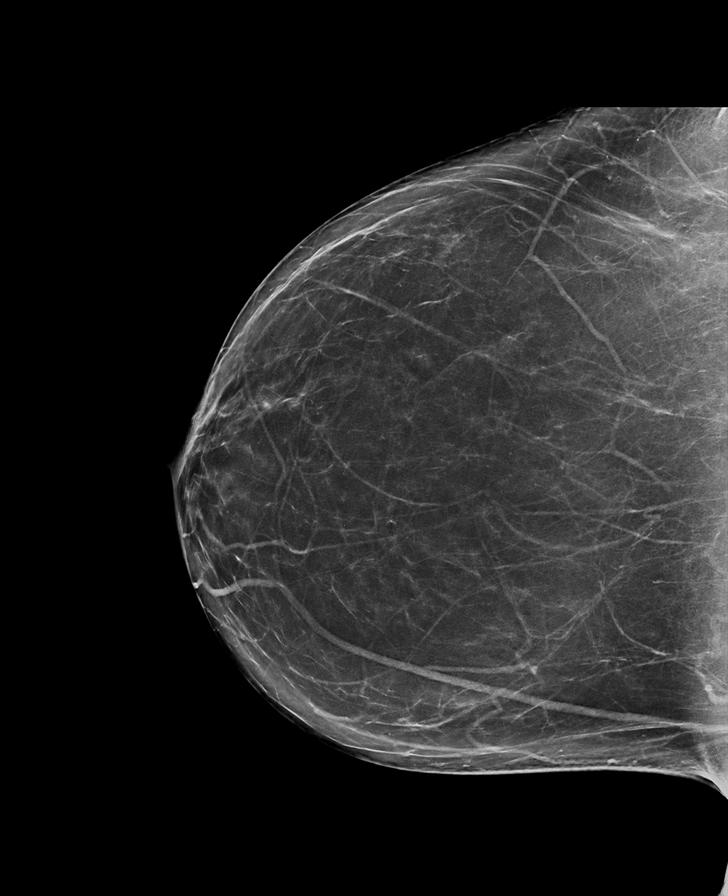

[R MLO synth-2D]
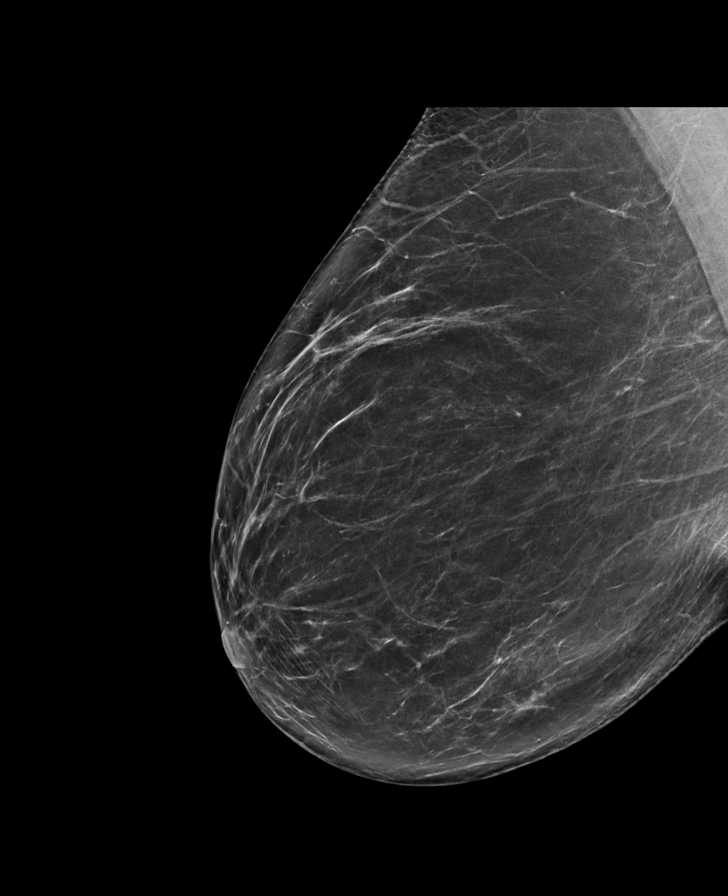

[L CC synth-2D]
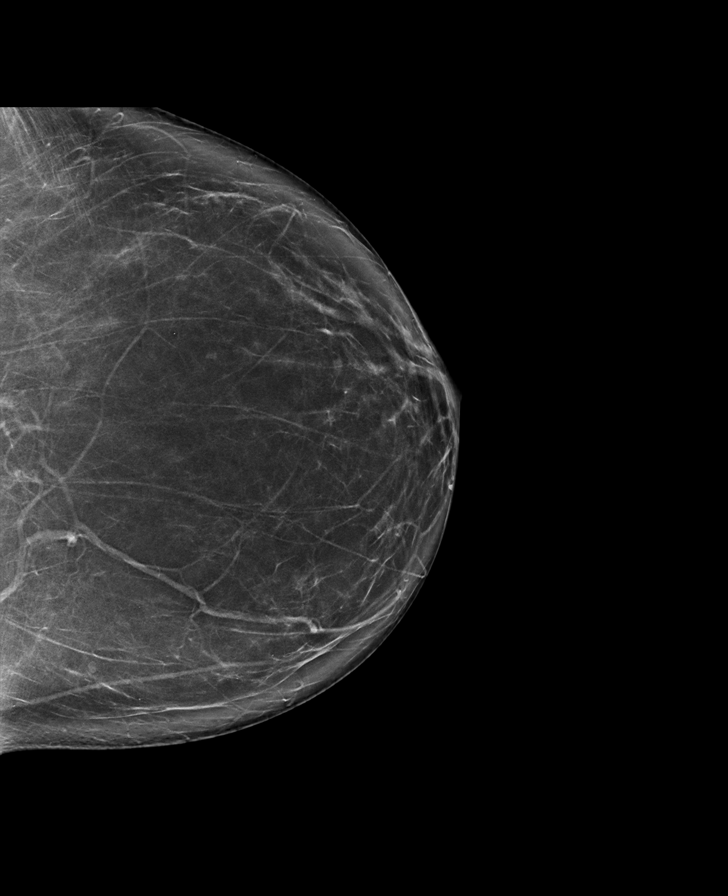

[L MLO synth-2D]
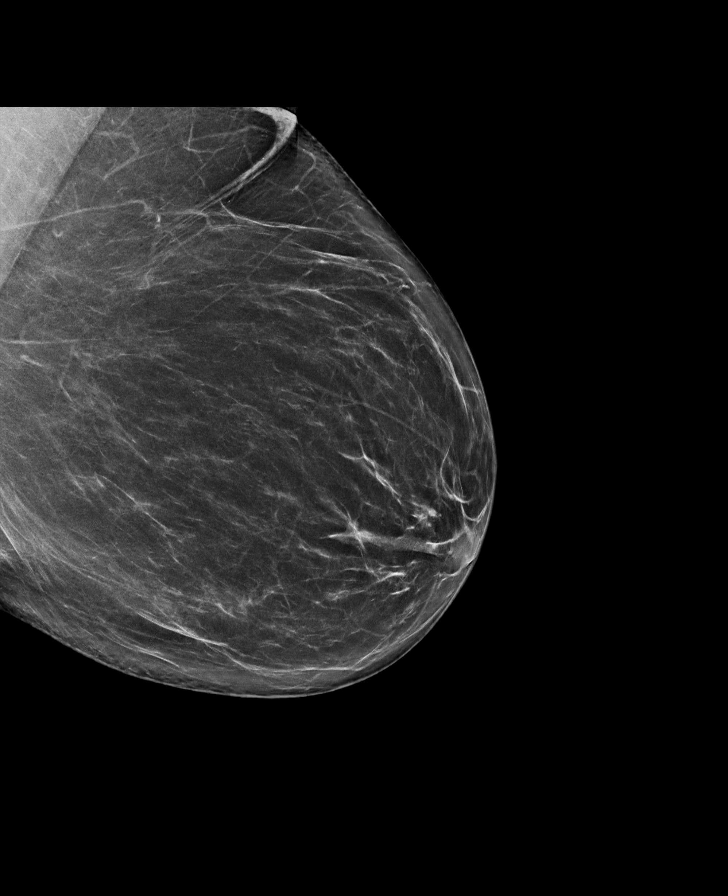

[R MLO tomo · tomo slice 41/82.0]
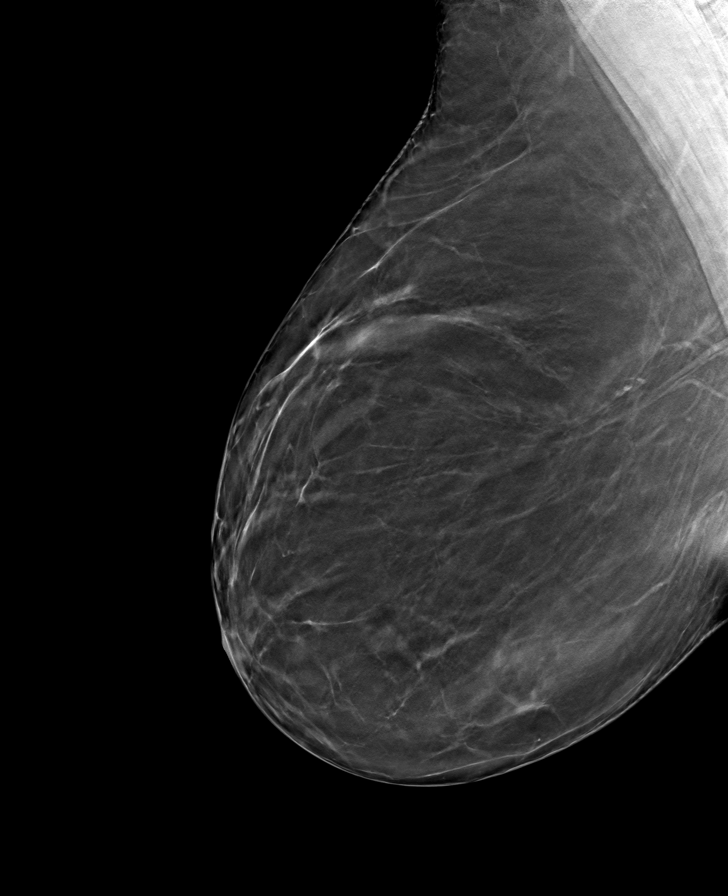

[L MLO tomo · tomo slice 41/82.0]
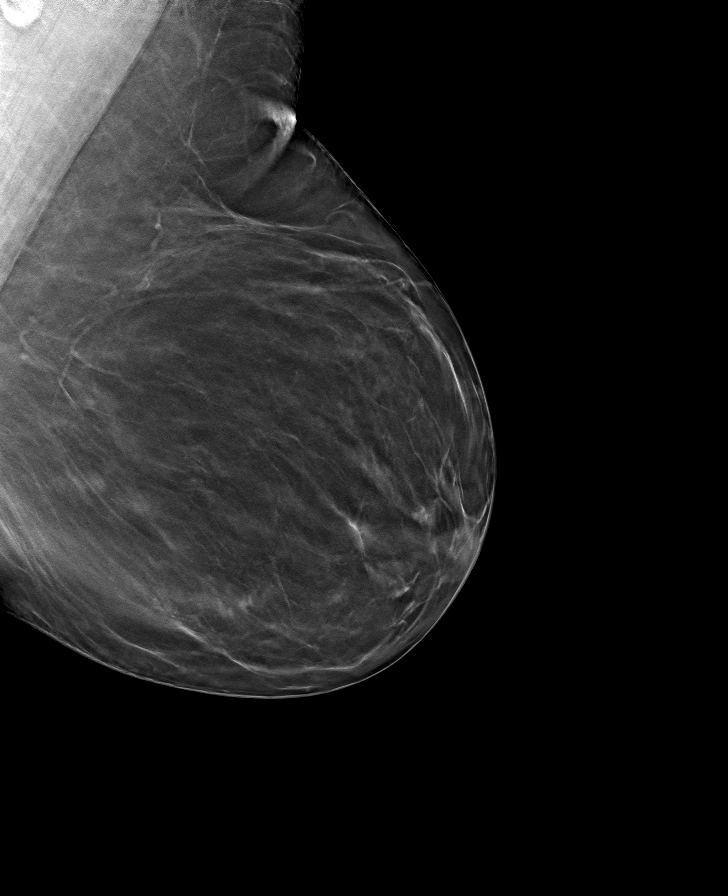

[L CC tomo · tomo slice 39/77.0]
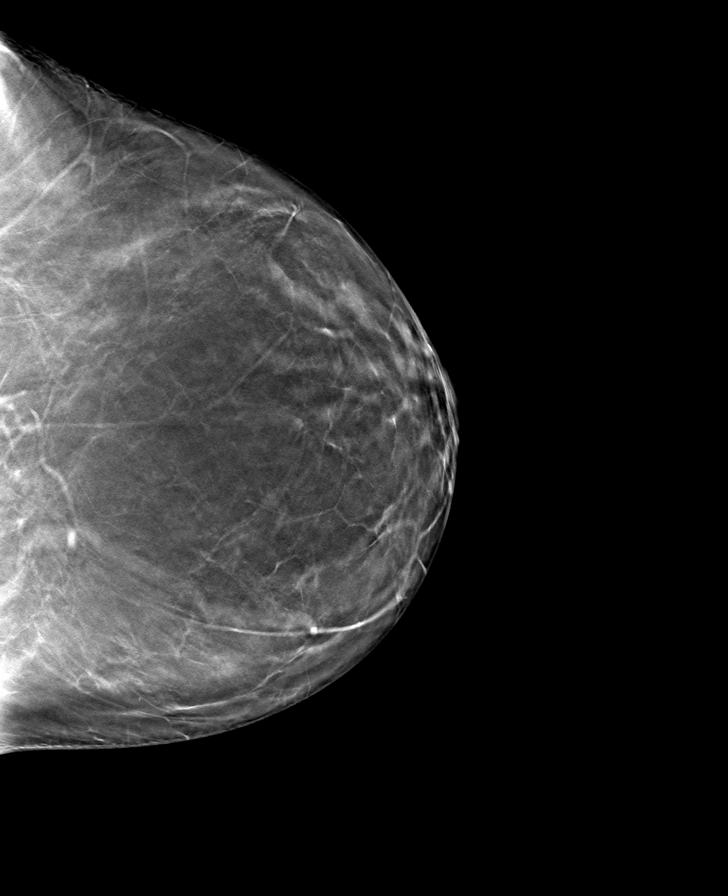

[R CC tomo · tomo slice 41/80.0]
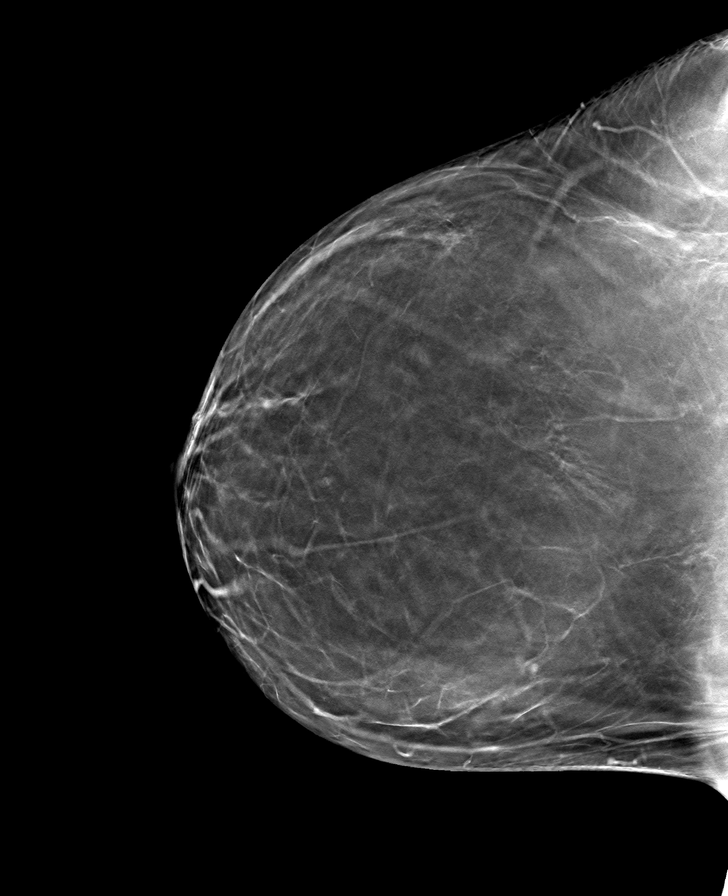

[8 of 24 positions shown; findings below may reference images not displayed]

ACR Breast Density Category b: There are scattered areas of
fibroglandular density.
FINDINGS: There are no findings suspicious for malignancy. Images were
processed with CAD.
IMPRESSION: No mammographic evidence of malignancy. A result letter of this
screening mammogram will be mailed directly to the patient.

RECOMMENDATION:
Screening mammogram in one year. (Code:CN-U-775)

BI-RADS CATEGORY  1: Negative.

## 2022-10-17 ENCOUNTER — Other Ambulatory Visit (HOSPITAL_COMMUNITY): Payer: Self-pay

## 2022-11-26 DIAGNOSIS — E785 Hyperlipidemia, unspecified: Secondary | ICD-10-CM | POA: Diagnosis not present

## 2022-11-27 ENCOUNTER — Other Ambulatory Visit (HOSPITAL_COMMUNITY): Payer: Self-pay

## 2022-11-27 DIAGNOSIS — G43009 Migraine without aura, not intractable, without status migrainosus: Secondary | ICD-10-CM | POA: Diagnosis not present

## 2022-11-27 DIAGNOSIS — M5441 Lumbago with sciatica, right side: Secondary | ICD-10-CM | POA: Diagnosis not present

## 2022-11-27 DIAGNOSIS — J301 Allergic rhinitis due to pollen: Secondary | ICD-10-CM | POA: Diagnosis not present

## 2022-11-27 DIAGNOSIS — G8929 Other chronic pain: Secondary | ICD-10-CM | POA: Diagnosis not present

## 2022-11-27 DIAGNOSIS — E785 Hyperlipidemia, unspecified: Secondary | ICD-10-CM | POA: Diagnosis not present

## 2022-11-27 MED ORDER — ROSUVASTATIN CALCIUM 10 MG PO TABS
10.0000 mg | ORAL_TABLET | Freq: Every day | ORAL | 1 refills | Status: DC
  Filled 2022-11-27: qty 90, 90d supply, fill #0
  Filled 2023-03-08: qty 90, 90d supply, fill #1

## 2022-11-27 MED ORDER — TOPIRAMATE 50 MG PO TABS
50.0000 mg | ORAL_TABLET | Freq: Every day | ORAL | 1 refills | Status: DC
  Filled 2022-11-27 – 2023-01-14 (×2): qty 90, 90d supply, fill #0
  Filled 2023-04-23: qty 90, 90d supply, fill #1

## 2022-11-27 MED ORDER — GABAPENTIN 300 MG PO CAPS
300.0000 mg | ORAL_CAPSULE | Freq: Three times a day (TID) | ORAL | 2 refills | Status: DC
  Filled 2022-11-27 – 2022-11-30 (×2): qty 60, 20d supply, fill #0
  Filled 2022-12-31: qty 60, 20d supply, fill #1
  Filled 2023-01-21: qty 60, 20d supply, fill #2

## 2022-11-28 ENCOUNTER — Other Ambulatory Visit: Payer: Self-pay

## 2022-11-28 ENCOUNTER — Other Ambulatory Visit (HOSPITAL_COMMUNITY): Payer: Self-pay

## 2022-11-30 ENCOUNTER — Other Ambulatory Visit: Payer: Self-pay

## 2022-11-30 ENCOUNTER — Other Ambulatory Visit (HOSPITAL_COMMUNITY): Payer: Self-pay

## 2022-12-22 ENCOUNTER — Telehealth: Payer: Commercial Managed Care - PPO | Admitting: Family Medicine

## 2022-12-22 DIAGNOSIS — N390 Urinary tract infection, site not specified: Secondary | ICD-10-CM

## 2022-12-22 MED ORDER — CEPHALEXIN 500 MG PO CAPS: 500.0000 mg | ORAL_CAPSULE | Freq: Two times a day (BID) | ORAL | 0 refills | Status: AC

## 2022-12-22 NOTE — Progress Notes (Signed)

## 2022-12-31 ENCOUNTER — Other Ambulatory Visit (HOSPITAL_COMMUNITY): Payer: Self-pay

## 2023-01-14 ENCOUNTER — Other Ambulatory Visit: Payer: Self-pay

## 2023-01-14 ENCOUNTER — Other Ambulatory Visit (HOSPITAL_COMMUNITY): Payer: Self-pay

## 2023-01-22 ENCOUNTER — Other Ambulatory Visit (HOSPITAL_COMMUNITY): Payer: Self-pay

## 2023-02-12 ENCOUNTER — Other Ambulatory Visit (HOSPITAL_COMMUNITY): Payer: Self-pay

## 2023-02-13 ENCOUNTER — Other Ambulatory Visit (HOSPITAL_COMMUNITY): Payer: Self-pay

## 2023-02-13 MED ORDER — GABAPENTIN 300 MG PO CAPS
300.0000 mg | ORAL_CAPSULE | Freq: Three times a day (TID) | ORAL | 0 refills | Status: DC
  Filled 2023-02-13: qty 90, 30d supply, fill #0

## 2023-03-08 ENCOUNTER — Other Ambulatory Visit (HOSPITAL_COMMUNITY): Payer: Self-pay

## 2023-03-11 ENCOUNTER — Other Ambulatory Visit: Payer: Self-pay

## 2023-03-11 ENCOUNTER — Other Ambulatory Visit (HOSPITAL_COMMUNITY): Payer: Self-pay

## 2023-03-11 MED ORDER — GABAPENTIN 300 MG PO CAPS
300.0000 mg | ORAL_CAPSULE | Freq: Three times a day (TID) | ORAL | 0 refills | Status: AC
  Filled 2023-03-11: qty 90, 30d supply, fill #0

## 2023-03-19 ENCOUNTER — Other Ambulatory Visit (HOSPITAL_COMMUNITY): Payer: Self-pay

## 2023-03-19 MED ORDER — GABAPENTIN 600 MG PO TABS
300.0000 mg | ORAL_TABLET | Freq: Three times a day (TID) | ORAL | 2 refills | Status: DC
  Filled 2023-03-19: qty 60, 20d supply, fill #0
  Filled 2023-05-07: qty 60, 20d supply, fill #1

## 2023-03-20 ENCOUNTER — Other Ambulatory Visit: Payer: Self-pay

## 2023-04-23 ENCOUNTER — Other Ambulatory Visit (HOSPITAL_COMMUNITY): Payer: Self-pay

## 2023-04-24 ENCOUNTER — Encounter: Payer: Self-pay | Admitting: *Deleted

## 2023-04-24 ENCOUNTER — Ambulatory Visit
Admission: EM | Admit: 2023-04-24 | Discharge: 2023-04-24 | Disposition: A | Discharge status: Home / Self Care | Payer: Commercial Managed Care - PPO

## 2023-04-24 DIAGNOSIS — M545 Low back pain, unspecified: Secondary | ICD-10-CM

## 2023-04-24 HISTORY — DX: Unspecified osteoarthritis, unspecified site: M19.90

## 2023-04-24 LAB — POCT URINALYSIS DIP (MANUAL ENTRY)
Bilirubin, UA: NEGATIVE
Blood, UA: NEGATIVE
Glucose, UA: NEGATIVE mg/dL
Ketones, POC UA: NEGATIVE mg/dL
Leukocytes, UA: NEGATIVE
Nitrite, UA: NEGATIVE
Protein Ur, POC: NEGATIVE mg/dL
Spec Grav, UA: 1.02 (ref 1.010–1.025)
Urobilinogen, UA: 0.2 E.U./dL
pH, UA: 5.5 (ref 5.0–8.0)

## 2023-04-24 MED ORDER — METHOCARBAMOL 500 MG PO TABS: 500.0000 mg | ORAL_TABLET | Freq: Four times a day (QID) | ORAL | 0 refills | Status: AC

## 2023-04-24 MED ORDER — IBUPROFEN 800 MG PO TABS: 800.0000 mg | ORAL_TABLET | Freq: Three times a day (TID) | ORAL | 0 refills | Status: AC | PRN

## 2023-04-24 NOTE — ED Provider Notes (Signed)
[facility]    CSN: [id number] Arrival date & time: 04/24/23  6295      History   Chief Complaint Chief Complaint  Patient presents with   Back Pain    HPI Kelsey Henderson is a 57 y.o. female.   Pt complains of low back pain.  Patient reports that she began having pain after her 2 grandchildren went to sleep in her lap on Monday.  Patient reports she is taken ibuprofen without relief.  Patient states that she has soreness in both sides of her back that radiated around to her abdomen  The history is provided by the patient. No language interpreter was used.  Back Pain Location:  Lumbar spine Quality:  Aching Radiates to:  Does not radiate Pain severity:  Moderate Pain is:  Worse during the day Duration:  3 days Timing:  Constant Progression:  Worsening Chronicity:  New Relieved by:  Nothing Worsened by:  Ambulation Ineffective treatments:  None tried   Past Medical History:  Diagnosis Date   Arthritis    Hyperlipidemia    Migraines    SVD (spontaneous vaginal delivery)    x 2    There are no problems to display for this patient.   Past Surgical History:  Procedure Laterality Date   CHOLECYSTECTOMY     WISDOM TOOTH EXTRACTION      OB History   No obstetric history on file.      Home Medications    Prior to Admission medications   Medication Sig Start Date End Date Taking? Authorizing Provider  Cholecalciferol (VITAMIN D3) 5000 units TABS Take by mouth.   Yes [provider]  gabapentin (NEURONTIN) 600 MG tablet Take 0.5-1 tablets (300-600 mg total) by mouth 3 (three) times daily. 03/19/23  Yes   ibuprofen (ADVIL) 800 MG tablet Take 1 tablet (800 mg total) by mouth every 8 (eight) hours as needed. 04/24/23  Yes Cheron Schaumann K, PA-C  methocarbamol (ROBAXIN) 500 MG tablet Take 1 tablet (500 mg total) by mouth 4 (four) times daily. 04/24/23  Yes Cheron Schaumann K, PA-C  Multiple Vitamins-Minerals (PRESERVISION AREDS PO) Take by mouth.    Yes [provider]  rosuvastatin (CRESTOR) 10 MG tablet Take 1 tablet (10 mg total) by mouth at bedtime. 11/27/22  Yes   topiramate (TOPAMAX) 50 MG tablet Take 1 tablet (50 mg total) by mouth at bedtime. 11/27/22  Yes   amoxicillin-clavulanate (AUGMENTIN) 875-125 MG tablet Take 1 tablet by mouth every 12 (twelve) hours. 04/06/21   Roxy Horseman, PA-C  atorvastatin (LIPITOR) 10 MG tablet Take 10 mg by mouth at bedtime.  04/13/16   [provider]  fluticasone (FLONASE) 50 MCG/ACT nasal spray Place 2 sprays into both nostrils 2 (two) times daily. 11/12/17   Dorena Bodo, NP  gabapentin (NEURONTIN) 100 MG capsule Take 1 capsule (100 mg total) by mouth 2 (two) times daily. 05/29/22   Lavonda Jumbo, PA-C  gabapentin (NEURONTIN) 300 MG capsule TAKE 1 CAPSULE BY MOUTH EVERY EVENING 11/14/20 11/14/21  Loyal Jacobson, MD  gabapentin (NEURONTIN) 300 MG capsule TAKE 1 CAPSULE BY MOUTH NIGHTLY 05/17/20 05/17/21  Loyal Jacobson, MD  gabapentin (NEURONTIN) 300 MG capsule Take 1 capsule (300 mg total) by mouth 3 (three) times daily. 03/11/23     nystatin (MYCOSTATIN/NYSTOP) powder Apply to affected areas twice daily 07/12/21     nystatin (MYCOSTATIN/NYSTOP) powder Apply  topically to affected area(s) 2 (two) times daily. 08/08/22     SUMAtriptan (IMITREX) 100  MG tablet Take 100 mg by mouth. 10/14/15   [provider]  SUMAtriptan (IMITREX) 100 MG tablet Take 1 tablet by mouth at onset of headache. May repeat once in 2 hours if needed 05/22/21     SUMAtriptan (IMITREX) 100 MG tablet Take 1 tablet at onset of headache. May repeat once in 2 hours if needed 11/20/21     SUMAtriptan (IMITREX) 100 MG tablet Take 1 tablet (100 mg total) by mouth at onset of headache; May repeat dose once in 2 hours if needed. 05/31/22     topiramate (TOPAMAX) 50 MG tablet Take 50 mg by mouth at bedtime.  04/13/16   [provider]  topiramate (TOPAMAX) 50 MG tablet TAKE 1 TABLET BY MOUTH NIGHTLY 05/17/20  05/17/21  Loyal Jacobson, MD  valACYclovir (VALTREX) 1000 MG tablet One tablet my mouth every  8 hours for  7 day 02/14/18   Doy Mince, PA-C  venlafaxine XR (EFFEXOR-XR) 37.5 MG 24 hr capsule TAKE 1 CAPSULE BY MOUTH DAILY 04/26/20 04/26/21  Laurann Montana., MD  venlafaxine XR (EFFEXOR-XR) 37.5 MG 24 hr capsule Take 1 capsule (37.5 mg total) by mouth every evening for 14 days. 07/23/22 08/06/22    venlafaxine XR (EFFEXOR-XR) 37.5 MG 24 hr capsule Take 1 capsule (37.5 mg total) by mouth at bedtime. 08/08/22       Family History Family History  Problem Relation Age of Onset   Macular degeneration Father    Breast cancer Sister 27   Breast cancer Sister 21   Colon cancer Neg Hx    Colon polyps Neg Hx    Esophageal cancer Neg Hx    Rectal cancer Neg Hx    Stomach cancer Neg Hx     Social History Social History   Tobacco Use   Smoking status: Never   Smokeless tobacco: Never  Vaping Use   Vaping status: Never Used  Substance Use Topics   Alcohol use: No   Drug use: No     Allergies   Naproxen   Review of Systems Review of Systems  Musculoskeletal:  Positive for back pain.  All other systems reviewed and are negative.    Physical Exam Triage Vital Signs ED Triage Vitals  Encounter Vitals Group     BP 04/24/23 0907 112/63     Systolic BP Percentile --      Diastolic BP Percentile --      Pulse Rate 04/24/23 0907 (!) 56     Resp 04/24/23 0907 18     Temp --      Temp src --      SpO2 04/24/23 0907 94 %     Weight --      Height --      Head Circumference --      Peak Flow --      Pain Score 04/24/23 0909 9     Pain Loc --      Pain Education --      Exclude from Growth Chart --    No data found.  Updated Vital Signs BP 112/63   Pulse (!) 56   Resp 18   SpO2 94%   Visual Acuity Right Eye Distance:   Left Eye Distance:   Bilateral Distance:    Right Eye Near:   Left Eye Near:    Bilateral Near:     Physical Exam Vitals and nursing note  reviewed.  Constitutional:      Appearance: She is well-developed.  HENT:     Head: Normocephalic.  Cardiovascular:     Rate and Rhythm: Normal rate.  Pulmonary:     Effort: Pulmonary effort is normal.  Abdominal:     General: Abdomen is flat. There is no distension.     Tenderness: There is no abdominal tenderness.  Musculoskeletal:     Cervical back: Normal range of motion.     Comments: Tender diffuse lower lumbar area full range of motion with pain neurovascular neurosensory intact  Skin:    General: Skin is warm.  Neurological:     General: No focal deficit present.     Mental Status: She is alert and oriented to person, place, and time.      UC Treatments / Results  Labs (all labs ordered are listed, but only abnormal results are displayed) Labs Reviewed  POCT URINALYSIS DIP (MANUAL ENTRY)    EKG   Radiology No results found.  Procedures Procedures (including critical care time)  Medications Ordered in UC Medications - No data to display  Initial Impression / Assessment and Plan / UC Course  I have reviewed the triage vital signs and the nursing notes.  Pertinent labs & imaging results that were available during my care of the patient were reviewed by me and considered in my medical decision making (see chart for details).     MDM I suspect patient's pain is musculoskeletal.  I will try treating the patient with Robaxin will give her prescription strength ibuprofen patient is advised to follow-up with her primary care physician next week for recheck. Final Clinical Impressions(s) / UC Diagnoses   Final diagnoses:  Acute low back pain, unspecified back pain laterality, unspecified whether sciatica present     Discharge Instructions      Continue ibuprofen.  Take muscle relaxer as directed. See your Physician for recheck.  Go to the Emergency department if increase in abdominal pain    ED Prescriptions     Medication Sig Dispense Auth. Provider    methocarbamol (ROBAXIN) 500 MG tablet Take 1 tablet (500 mg total) by mouth 4 (four) times daily. 20 tablet Mechele Kittleson K, [place]   ibuprofen (ADVIL) 800 MG tablet Take 1 tablet (800 mg total) by mouth every 8 (eight) hours as needed. 30 tablet Elson Areas, [place]      PDMP not reviewed this encounter. An After Visit Summary was printed and given to the patient.       Elson Areas, [place] 04/24/23 [phone]

## 2023-04-24 NOTE — ED Triage Notes (Signed)
Pt c/o low back pain wrapping around to abd onset 2 days ago; describes muscle spasms. States her grandchildren fell asleep on her 2 days ago, so she sat in one position for a couple hours, and pain started shortly after. Denies any parasthesias. Pt with very guarded movements and ambulation. States normally takes gabapentin, but also tried IBU without significant relief.

## 2023-04-24 NOTE — Discharge Instructions (Signed)
Continue ibuprofen.  Take muscle relaxer as directed. See your Physician for recheck.  Go to the Emergency department if increase in abdominal pain

## 2023-05-02 ENCOUNTER — Other Ambulatory Visit (HOSPITAL_COMMUNITY): Payer: Self-pay

## 2023-05-02 DIAGNOSIS — R109 Unspecified abdominal pain: Secondary | ICD-10-CM | POA: Diagnosis not present

## 2023-05-02 DIAGNOSIS — N8110 Cystocele, unspecified: Secondary | ICD-10-CM | POA: Diagnosis not present

## 2023-05-02 DIAGNOSIS — N8184 Pelvic muscle wasting: Secondary | ICD-10-CM | POA: Diagnosis not present

## 2023-05-02 DIAGNOSIS — R10823 Right lower quadrant rebound abdominal tenderness: Secondary | ICD-10-CM | POA: Diagnosis not present

## 2023-05-02 DIAGNOSIS — Z9049 Acquired absence of other specified parts of digestive tract: Secondary | ICD-10-CM | POA: Diagnosis not present

## 2023-05-02 DIAGNOSIS — R1031 Right lower quadrant pain: Secondary | ICD-10-CM | POA: Diagnosis not present

## 2023-05-02 DIAGNOSIS — R10816 Epigastric abdominal tenderness: Secondary | ICD-10-CM | POA: Diagnosis not present

## 2023-05-02 DIAGNOSIS — R630 Anorexia: Secondary | ICD-10-CM | POA: Diagnosis not present

## 2023-05-02 DIAGNOSIS — I700 Atherosclerosis of aorta: Secondary | ICD-10-CM | POA: Diagnosis not present

## 2023-05-02 DIAGNOSIS — M545 Low back pain, unspecified: Secondary | ICD-10-CM | POA: Diagnosis not present

## 2023-05-02 MED ORDER — TRAMADOL HCL 50 MG PO TABS
50.0000 mg | ORAL_TABLET | Freq: Four times a day (QID) | ORAL | 0 refills | Status: AC | PRN
  Filled 2023-05-02 – 2023-05-03 (×2): qty 20, 5d supply, fill #0

## 2023-05-03 ENCOUNTER — Other Ambulatory Visit: Payer: Self-pay

## 2023-05-03 ENCOUNTER — Other Ambulatory Visit (HOSPITAL_COMMUNITY): Payer: Self-pay

## 2023-05-07 ENCOUNTER — Other Ambulatory Visit (HOSPITAL_COMMUNITY): Payer: Self-pay

## 2023-05-09 ENCOUNTER — Other Ambulatory Visit (HOSPITAL_COMMUNITY): Payer: Self-pay

## 2023-05-25 DIAGNOSIS — E559 Vitamin D deficiency, unspecified: Secondary | ICD-10-CM | POA: Diagnosis not present

## 2023-05-25 DIAGNOSIS — Z0000 Encounter for general adult medical examination without abnormal findings: Secondary | ICD-10-CM | POA: Diagnosis not present

## 2023-05-31 ENCOUNTER — Other Ambulatory Visit: Payer: Self-pay

## 2023-05-31 ENCOUNTER — Other Ambulatory Visit (HOSPITAL_COMMUNITY): Payer: Self-pay

## 2023-05-31 DIAGNOSIS — J301 Allergic rhinitis due to pollen: Secondary | ICD-10-CM | POA: Diagnosis not present

## 2023-05-31 DIAGNOSIS — E669 Obesity, unspecified: Secondary | ICD-10-CM | POA: Diagnosis not present

## 2023-05-31 DIAGNOSIS — G43009 Migraine without aura, not intractable, without status migrainosus: Secondary | ICD-10-CM | POA: Diagnosis not present

## 2023-05-31 DIAGNOSIS — R1013 Epigastric pain: Secondary | ICD-10-CM | POA: Diagnosis not present

## 2023-05-31 DIAGNOSIS — M5441 Lumbago with sciatica, right side: Secondary | ICD-10-CM | POA: Diagnosis not present

## 2023-05-31 DIAGNOSIS — Z0000 Encounter for general adult medical examination without abnormal findings: Secondary | ICD-10-CM | POA: Diagnosis not present

## 2023-05-31 DIAGNOSIS — Z1159 Encounter for screening for other viral diseases: Secondary | ICD-10-CM | POA: Diagnosis not present

## 2023-05-31 DIAGNOSIS — E785 Hyperlipidemia, unspecified: Secondary | ICD-10-CM | POA: Diagnosis not present

## 2023-05-31 DIAGNOSIS — E559 Vitamin D deficiency, unspecified: Secondary | ICD-10-CM | POA: Diagnosis not present

## 2023-05-31 DIAGNOSIS — Z1211 Encounter for screening for malignant neoplasm of colon: Secondary | ICD-10-CM | POA: Diagnosis not present

## 2023-05-31 DIAGNOSIS — Z23 Encounter for immunization: Secondary | ICD-10-CM | POA: Diagnosis not present

## 2023-05-31 MED ORDER — ROSUVASTATIN CALCIUM 10 MG PO TABS
10.0000 mg | ORAL_TABLET | Freq: Every evening | ORAL | 1 refills | Status: DC
  Filled 2023-05-31: qty 90, 90d supply, fill #0
  Filled 2023-09-02 – 2023-09-06 (×2): qty 90, 90d supply, fill #1

## 2023-05-31 MED ORDER — TOPIRAMATE 50 MG PO TABS
50.0000 mg | ORAL_TABLET | Freq: Every evening | ORAL | 1 refills | Status: DC
  Filled 2023-07-19: qty 90, 90d supply, fill #0
  Filled 2023-10-16: qty 90, 90d supply, fill #1

## 2023-05-31 MED ORDER — SUMATRIPTAN SUCCINATE 100 MG PO TABS
ORAL_TABLET | ORAL | 5 refills | Status: AC
  Filled 2023-05-31: qty 9, 30d supply, fill #0
  Filled 2023-10-16: qty 9, 30d supply, fill #1

## 2023-05-31 MED ORDER — FLUTICASONE PROPIONATE 50 MCG/ACT NA SUSP
2.0000 | Freq: Every day | NASAL | 3 refills | Status: DC
  Filled 2023-05-31: qty 16, 30d supply, fill #0
  Filled 2023-11-17: qty 16, 30d supply, fill #1
  Filled 2023-12-15: qty 16, 30d supply, fill #2
  Filled 2024-01-14: qty 16, 30d supply, fill #3

## 2023-05-31 MED ORDER — GABAPENTIN 600 MG PO TABS
600.0000 mg | ORAL_TABLET | Freq: Two times a day (BID) | ORAL | 1 refills | Status: DC
  Filled 2023-05-31: qty 180, 90d supply, fill #0
  Filled 2023-09-02 – 2023-09-06 (×2): qty 180, 90d supply, fill #1

## 2023-06-03 DIAGNOSIS — R1013 Epigastric pain: Secondary | ICD-10-CM | POA: Diagnosis not present

## 2023-06-07 ENCOUNTER — Other Ambulatory Visit (HOSPITAL_COMMUNITY): Payer: Self-pay

## 2023-06-07 DIAGNOSIS — E669 Obesity, unspecified: Secondary | ICD-10-CM | POA: Diagnosis not present

## 2023-06-07 MED ORDER — NALTREXONE HCL 50 MG PO TABS
25.0000 mg | ORAL_TABLET | Freq: Every day | ORAL | 2 refills | Status: AC
  Filled 2023-06-07: qty 30, 30d supply, fill #0
  Filled 2023-07-19: qty 30, 30d supply, fill #1
  Filled 2023-11-13 – 2023-12-15 (×3): qty 30, 30d supply, fill #2

## 2023-06-07 MED ORDER — BUPROPION HCL ER (XL) 150 MG PO TB24
150.0000 mg | ORAL_TABLET | Freq: Every morning | ORAL | 2 refills | Status: AC
  Filled 2023-06-07: qty 60, 30d supply, fill #0
  Filled 2023-07-19: qty 60, 30d supply, fill #1
  Filled 2024-01-29: qty 60, 30d supply, fill #2

## 2023-06-29 ENCOUNTER — Other Ambulatory Visit: Payer: Self-pay | Admitting: Obstetrics and Gynecology

## 2023-06-29 DIAGNOSIS — Z1231 Encounter for screening mammogram for malignant neoplasm of breast: Secondary | ICD-10-CM

## 2023-07-19 ENCOUNTER — Other Ambulatory Visit (HOSPITAL_COMMUNITY): Payer: Self-pay

## 2023-07-26 ENCOUNTER — Other Ambulatory Visit (HOSPITAL_COMMUNITY): Payer: Self-pay

## 2023-07-29 ENCOUNTER — Ambulatory Visit
Admission: RE | Admit: 2023-07-29 | Discharge: 2023-07-29 | Disposition: A | Discharge status: Home / Self Care | Payer: Commercial Managed Care - PPO | Source: Ambulatory Visit

## 2023-07-29 DIAGNOSIS — Z1231 Encounter for screening mammogram for malignant neoplasm of breast: Secondary | ICD-10-CM

## 2023-08-14 DIAGNOSIS — Z01419 Encounter for gynecological examination (general) (routine) without abnormal findings: Secondary | ICD-10-CM | POA: Diagnosis not present

## 2023-08-14 DIAGNOSIS — Z1231 Encounter for screening mammogram for malignant neoplasm of breast: Secondary | ICD-10-CM | POA: Diagnosis not present

## 2023-08-14 DIAGNOSIS — Z1151 Encounter for screening for human papillomavirus (HPV): Secondary | ICD-10-CM | POA: Diagnosis not present

## 2023-09-02 ENCOUNTER — Other Ambulatory Visit (HOSPITAL_BASED_OUTPATIENT_CLINIC_OR_DEPARTMENT_OTHER): Payer: Self-pay

## 2023-09-06 ENCOUNTER — Other Ambulatory Visit (HOSPITAL_COMMUNITY): Payer: Self-pay

## 2023-09-09 DIAGNOSIS — E669 Obesity, unspecified: Secondary | ICD-10-CM | POA: Diagnosis not present

## 2023-09-10 ENCOUNTER — Other Ambulatory Visit (HOSPITAL_COMMUNITY): Payer: Self-pay

## 2023-09-10 ENCOUNTER — Other Ambulatory Visit: Payer: Self-pay

## 2023-09-10 MED ORDER — BUPROPION HCL ER (XL) 300 MG PO TB24
300.0000 mg | ORAL_TABLET | Freq: Every morning | ORAL | 2 refills | Status: DC
  Filled 2023-09-10: qty 30, 30d supply, fill #0
  Filled 2023-10-14: qty 30, 30d supply, fill #1
  Filled 2023-11-13: qty 30, 30d supply, fill #2

## 2023-09-10 MED ORDER — NALTREXONE HCL 50 MG PO TABS
50.0000 mg | ORAL_TABLET | Freq: Every day | ORAL | 2 refills | Status: AC
  Filled 2023-09-10: qty 30, 30d supply, fill #0
  Filled 2023-10-22: qty 30, 30d supply, fill #1
  Filled 2023-11-17: qty 30, 30d supply, fill #2

## 2023-09-11 ENCOUNTER — Other Ambulatory Visit: Payer: Self-pay

## 2023-09-17 ENCOUNTER — Other Ambulatory Visit (HOSPITAL_COMMUNITY): Payer: Self-pay

## 2023-10-14 ENCOUNTER — Other Ambulatory Visit (HOSPITAL_COMMUNITY): Payer: Self-pay

## 2023-10-16 ENCOUNTER — Other Ambulatory Visit (HOSPITAL_COMMUNITY): Payer: Self-pay

## 2023-10-23 ENCOUNTER — Other Ambulatory Visit: Payer: Self-pay

## 2023-11-14 ENCOUNTER — Other Ambulatory Visit (HOSPITAL_COMMUNITY): Payer: Self-pay

## 2023-11-14 ENCOUNTER — Other Ambulatory Visit: Payer: Self-pay

## 2023-11-18 ENCOUNTER — Other Ambulatory Visit: Payer: Self-pay

## 2023-11-19 ENCOUNTER — Other Ambulatory Visit: Payer: Self-pay

## 2023-11-20 ENCOUNTER — Other Ambulatory Visit (HOSPITAL_COMMUNITY): Payer: Self-pay

## 2023-11-29 DIAGNOSIS — E785 Hyperlipidemia, unspecified: Secondary | ICD-10-CM | POA: Diagnosis not present

## 2023-11-29 DIAGNOSIS — E559 Vitamin D deficiency, unspecified: Secondary | ICD-10-CM | POA: Diagnosis not present

## 2023-12-02 ENCOUNTER — Other Ambulatory Visit (HOSPITAL_COMMUNITY): Payer: Self-pay

## 2023-12-02 DIAGNOSIS — E559 Vitamin D deficiency, unspecified: Secondary | ICD-10-CM | POA: Diagnosis not present

## 2023-12-02 DIAGNOSIS — G43009 Migraine without aura, not intractable, without status migrainosus: Secondary | ICD-10-CM | POA: Diagnosis not present

## 2023-12-02 DIAGNOSIS — E785 Hyperlipidemia, unspecified: Secondary | ICD-10-CM | POA: Diagnosis not present

## 2023-12-02 DIAGNOSIS — M5441 Lumbago with sciatica, right side: Secondary | ICD-10-CM | POA: Diagnosis not present

## 2023-12-02 DIAGNOSIS — E669 Obesity, unspecified: Secondary | ICD-10-CM | POA: Diagnosis not present

## 2023-12-02 DIAGNOSIS — R7303 Prediabetes: Secondary | ICD-10-CM | POA: Diagnosis not present

## 2023-12-02 DIAGNOSIS — Z131 Encounter for screening for diabetes mellitus: Secondary | ICD-10-CM | POA: Diagnosis not present

## 2023-12-02 DIAGNOSIS — G8929 Other chronic pain: Secondary | ICD-10-CM | POA: Diagnosis not present

## 2023-12-02 MED ORDER — BUPROPION HCL ER (XL) 300 MG PO TB24
300.0000 mg | ORAL_TABLET | Freq: Every morning | ORAL | 1 refills | Status: DC
  Filled 2023-12-02 – 2023-12-15 (×2): qty 90, 90d supply, fill #0
  Filled 2024-03-10: qty 90, 90d supply, fill #1

## 2023-12-02 MED ORDER — GABAPENTIN 600 MG PO TABS
600.0000 mg | ORAL_TABLET | Freq: Two times a day (BID) | ORAL | 1 refills | Status: AC
  Filled 2023-12-02: qty 180, 90d supply, fill #0
  Filled 2024-02-28: qty 180, 90d supply, fill #1

## 2023-12-02 MED ORDER — TOPIRAMATE 50 MG PO TABS
50.0000 mg | ORAL_TABLET | Freq: Every evening | ORAL | 1 refills | Status: DC
  Filled 2023-12-02 – 2024-01-14 (×2): qty 90, 90d supply, fill #0
  Filled 2024-04-17: qty 90, 90d supply, fill #1

## 2023-12-02 MED ORDER — ROSUVASTATIN CALCIUM 10 MG PO TABS
10.0000 mg | ORAL_TABLET | Freq: Every evening | ORAL | 1 refills | Status: AC
  Filled 2023-12-02: qty 90, 90d supply, fill #0
  Filled 2024-02-28: qty 90, 90d supply, fill #1

## 2023-12-02 MED ORDER — NALTREXONE HCL 50 MG PO TABS
50.0000 mg | ORAL_TABLET | Freq: Every day | ORAL | 1 refills | Status: DC
  Filled 2023-12-02 – 2024-01-09 (×3): qty 90, 90d supply, fill #0
  Filled 2024-04-08 (×2): qty 90, 90d supply, fill #1

## 2023-12-02 MED ORDER — METFORMIN HCL ER 500 MG PO TB24
500.0000 mg | ORAL_TABLET | Freq: Every day | ORAL | 1 refills | Status: DC
  Filled 2023-12-02: qty 90, 90d supply, fill #0
  Filled 2024-02-26: qty 90, 90d supply, fill #1

## 2023-12-03 ENCOUNTER — Other Ambulatory Visit: Payer: Self-pay

## 2023-12-16 ENCOUNTER — Other Ambulatory Visit (HOSPITAL_COMMUNITY): Payer: Self-pay

## 2023-12-16 ENCOUNTER — Other Ambulatory Visit: Payer: Self-pay

## 2023-12-24 DIAGNOSIS — H524 Presbyopia: Secondary | ICD-10-CM | POA: Diagnosis not present

## 2024-01-07 ENCOUNTER — Other Ambulatory Visit: Payer: Self-pay

## 2024-01-07 ENCOUNTER — Other Ambulatory Visit (HOSPITAL_COMMUNITY): Payer: Self-pay

## 2024-01-09 ENCOUNTER — Other Ambulatory Visit (HOSPITAL_COMMUNITY): Payer: Self-pay

## 2024-01-09 ENCOUNTER — Other Ambulatory Visit: Payer: Self-pay

## 2024-01-14 ENCOUNTER — Other Ambulatory Visit (HOSPITAL_COMMUNITY): Payer: Self-pay

## 2024-01-15 ENCOUNTER — Other Ambulatory Visit: Payer: Self-pay

## 2024-01-29 ENCOUNTER — Other Ambulatory Visit (HOSPITAL_COMMUNITY): Payer: Self-pay

## 2024-02-13 ENCOUNTER — Other Ambulatory Visit (HOSPITAL_COMMUNITY): Payer: Self-pay

## 2024-02-15 ENCOUNTER — Other Ambulatory Visit (HOSPITAL_COMMUNITY): Payer: Self-pay

## 2024-02-17 ENCOUNTER — Other Ambulatory Visit (HOSPITAL_COMMUNITY): Payer: Self-pay

## 2024-02-17 MED ORDER — FLUTICASONE PROPIONATE 50 MCG/ACT NA SUSP
2.0000 | Freq: Every day | NASAL | 3 refills | Status: AC
  Filled 2024-02-17: qty 16, 30d supply, fill #0
  Filled 2024-05-16: qty 16, 30d supply, fill #1
  Filled 2024-09-20: qty 16, 30d supply, fill #2

## 2024-02-18 ENCOUNTER — Other Ambulatory Visit (HOSPITAL_COMMUNITY): Payer: Self-pay

## 2024-02-26 ENCOUNTER — Other Ambulatory Visit (HOSPITAL_COMMUNITY): Payer: Self-pay

## 2024-02-26 ENCOUNTER — Other Ambulatory Visit: Payer: Self-pay

## 2024-02-28 ENCOUNTER — Other Ambulatory Visit (HOSPITAL_COMMUNITY): Payer: Self-pay

## 2024-02-29 ENCOUNTER — Other Ambulatory Visit (HOSPITAL_COMMUNITY): Payer: Self-pay

## 2024-03-03 ENCOUNTER — Other Ambulatory Visit: Payer: Self-pay

## 2024-03-11 ENCOUNTER — Other Ambulatory Visit (HOSPITAL_COMMUNITY): Payer: Self-pay

## 2024-03-13 ENCOUNTER — Other Ambulatory Visit (HOSPITAL_COMMUNITY): Payer: Self-pay

## 2024-04-09 ENCOUNTER — Other Ambulatory Visit (HOSPITAL_COMMUNITY): Payer: Self-pay

## 2024-04-17 ENCOUNTER — Other Ambulatory Visit (HOSPITAL_COMMUNITY): Payer: Self-pay

## 2024-05-17 ENCOUNTER — Other Ambulatory Visit (HOSPITAL_COMMUNITY): Payer: Self-pay

## 2024-05-25 DIAGNOSIS — E559 Vitamin D deficiency, unspecified: Secondary | ICD-10-CM | POA: Diagnosis not present

## 2024-05-25 DIAGNOSIS — E785 Hyperlipidemia, unspecified: Secondary | ICD-10-CM | POA: Diagnosis not present

## 2024-05-28 ENCOUNTER — Other Ambulatory Visit (HOSPITAL_COMMUNITY): Payer: Self-pay

## 2024-05-28 MED ORDER — FLUZONE 0.5 ML IM SUSY
0.5000 mL | PREFILLED_SYRINGE | Freq: Once | INTRAMUSCULAR | 0 refills | Status: AC
  Filled 2024-05-28: qty 0.5, 1d supply, fill #0

## 2024-06-01 DIAGNOSIS — Z23 Encounter for immunization: Secondary | ICD-10-CM | POA: Diagnosis not present

## 2024-06-01 DIAGNOSIS — J301 Allergic rhinitis due to pollen: Secondary | ICD-10-CM | POA: Diagnosis not present

## 2024-06-01 DIAGNOSIS — Z0000 Encounter for general adult medical examination without abnormal findings: Secondary | ICD-10-CM | POA: Diagnosis not present

## 2024-06-01 DIAGNOSIS — E669 Obesity, unspecified: Secondary | ICD-10-CM | POA: Diagnosis not present

## 2024-06-01 DIAGNOSIS — N951 Menopausal and female climacteric states: Secondary | ICD-10-CM | POA: Diagnosis not present

## 2024-06-01 DIAGNOSIS — G43009 Migraine without aura, not intractable, without status migrainosus: Secondary | ICD-10-CM | POA: Diagnosis not present

## 2024-06-01 DIAGNOSIS — R7303 Prediabetes: Secondary | ICD-10-CM | POA: Diagnosis not present

## 2024-06-01 DIAGNOSIS — M5441 Lumbago with sciatica, right side: Secondary | ICD-10-CM | POA: Diagnosis not present

## 2024-06-01 DIAGNOSIS — Z1211 Encounter for screening for malignant neoplasm of colon: Secondary | ICD-10-CM | POA: Diagnosis not present

## 2024-06-01 DIAGNOSIS — E559 Vitamin D deficiency, unspecified: Secondary | ICD-10-CM | POA: Diagnosis not present

## 2024-06-01 DIAGNOSIS — E785 Hyperlipidemia, unspecified: Secondary | ICD-10-CM | POA: Diagnosis not present

## 2024-06-02 ENCOUNTER — Other Ambulatory Visit: Payer: Self-pay

## 2024-06-02 ENCOUNTER — Other Ambulatory Visit (HOSPITAL_COMMUNITY): Payer: Self-pay

## 2024-06-02 MED ORDER — FLUTICASONE PROPIONATE 50 MCG/ACT NA SUSP
2.0000 | Freq: Every day | NASAL | 3 refills | Status: AC
  Filled 2024-06-02 – 2024-06-10 (×4): qty 48, 90d supply, fill #0
  Filled 2024-09-21: qty 48, 90d supply, fill #1

## 2024-06-02 MED ORDER — TOPIRAMATE 50 MG PO TABS
50.0000 mg | ORAL_TABLET | Freq: Every evening | ORAL | 1 refills | Status: AC
  Filled 2024-06-02 – 2024-06-29 (×3): qty 90, 90d supply, fill #0
  Filled 2024-09-22: qty 90, 90d supply, fill #1

## 2024-06-02 MED ORDER — FROVATRIPTAN SUCCINATE 2.5 MG PO TABS
2.5000 mg | ORAL_TABLET | Freq: Every day | ORAL | 0 refills | Status: DC
  Filled 2024-06-02: qty 3, 3d supply, fill #0

## 2024-06-02 MED ORDER — GABAPENTIN 600 MG PO TABS
600.0000 mg | ORAL_TABLET | Freq: Two times a day (BID) | ORAL | 1 refills | Status: AC
  Filled 2024-06-02: qty 180, 90d supply, fill #0
  Filled 2024-08-27: qty 180, 90d supply, fill #1

## 2024-06-02 MED ORDER — ROSUVASTATIN CALCIUM 10 MG PO TABS
10.0000 mg | ORAL_TABLET | Freq: Every evening | ORAL | 1 refills | Status: AC
  Filled 2024-06-02: qty 90, 90d supply, fill #0
  Filled 2024-08-27: qty 90, 90d supply, fill #1

## 2024-06-02 MED ORDER — BUPROPION HCL ER (XL) 150 MG PO TB24
150.0000 mg | ORAL_TABLET | Freq: Every morning | ORAL | 0 refills | Status: DC
  Filled 2024-06-02: qty 30, 30d supply, fill #0

## 2024-06-02 MED ORDER — SUMATRIPTAN SUCCINATE 100 MG PO TABS
ORAL_TABLET | ORAL | 5 refills | Status: AC
  Filled 2024-06-02: qty 9, 21d supply, fill #0
  Filled 2024-09-20: qty 9, 21d supply, fill #1

## 2024-06-03 ENCOUNTER — Other Ambulatory Visit: Payer: Self-pay

## 2024-06-03 ENCOUNTER — Other Ambulatory Visit (HOSPITAL_COMMUNITY): Payer: Self-pay

## 2024-06-03 ENCOUNTER — Encounter: Payer: Self-pay | Admitting: Pharmacist

## 2024-06-06 ENCOUNTER — Other Ambulatory Visit (HOSPITAL_COMMUNITY): Payer: Self-pay

## 2024-06-10 ENCOUNTER — Other Ambulatory Visit: Payer: Self-pay

## 2024-06-10 ENCOUNTER — Other Ambulatory Visit (HOSPITAL_COMMUNITY): Payer: Self-pay

## 2024-06-25 ENCOUNTER — Other Ambulatory Visit (HOSPITAL_COMMUNITY): Payer: Self-pay

## 2024-06-29 ENCOUNTER — Other Ambulatory Visit: Payer: Self-pay

## 2024-06-29 ENCOUNTER — Other Ambulatory Visit (HOSPITAL_COMMUNITY): Payer: Self-pay

## 2024-06-29 ENCOUNTER — Other Ambulatory Visit: Payer: Self-pay | Admitting: Obstetrics and Gynecology

## 2024-06-29 DIAGNOSIS — Z1231 Encounter for screening mammogram for malignant neoplasm of breast: Secondary | ICD-10-CM

## 2024-07-09 ENCOUNTER — Other Ambulatory Visit (HOSPITAL_COMMUNITY): Payer: Self-pay

## 2024-07-09 MED ORDER — HEPLISAV-B 20 MCG/0.5ML IM SOSY
0.5000 mL | PREFILLED_SYRINGE | INTRAMUSCULAR | 0 refills | Status: DC
  Filled 2024-07-09: qty 0.5, 30d supply, fill #0
  Filled 2024-07-09: qty 0.5, 1d supply, fill #0

## 2024-07-29 ENCOUNTER — Ambulatory Visit: Admission: RE | Admit: 2024-07-29 | Discharge: 2024-07-29 | Disposition: A | Source: Ambulatory Visit

## 2024-07-29 DIAGNOSIS — Z1231 Encounter for screening mammogram for malignant neoplasm of breast: Secondary | ICD-10-CM | POA: Diagnosis not present

## 2024-08-17 ENCOUNTER — Other Ambulatory Visit (HOSPITAL_COMMUNITY): Payer: Self-pay

## 2024-08-17 MED ORDER — ESTRADIOL 0.01 % VA CREA
TOPICAL_CREAM | VAGINAL | 5 refills | Status: AC
  Filled 2024-08-17: qty 42.5, 90d supply, fill #0

## 2024-08-18 ENCOUNTER — Other Ambulatory Visit: Payer: Self-pay

## 2024-08-26 ENCOUNTER — Other Ambulatory Visit (HOSPITAL_COMMUNITY): Payer: Self-pay

## 2024-08-26 MED ORDER — HEPLISAV-B 20 MCG/0.5ML IM SOSY
PREFILLED_SYRINGE | INTRAMUSCULAR | 1 refills | Status: AC
  Filled 2024-08-26: qty 0.5, 1d supply, fill #0

## 2024-08-28 ENCOUNTER — Other Ambulatory Visit (HOSPITAL_COMMUNITY): Payer: Self-pay

## 2024-09-21 ENCOUNTER — Other Ambulatory Visit: Payer: Self-pay

## 2024-09-22 ENCOUNTER — Other Ambulatory Visit: Payer: Self-pay
